# Patient Record
Sex: Male | Born: 1950
Health system: Southern US, Community
[De-identification: ages and names within clinical notes are randomized; demographics above are authoritative.]

## PROBLEM LIST (undated history)

## (undated) DIAGNOSIS — E785 Hyperlipidemia, unspecified: Secondary | ICD-10-CM

## (undated) DIAGNOSIS — R7303 Prediabetes: Secondary | ICD-10-CM

## (undated) DIAGNOSIS — I10 Essential (primary) hypertension: Secondary | ICD-10-CM

## (undated) HISTORY — DX: Hyperlipidemia, unspecified: E78.5

## (undated) HISTORY — PX: EYE SURGERY: SHX253

## (undated) HISTORY — DX: Essential (primary) hypertension: I10

## (undated) HISTORY — DX: Prediabetes: R73.03

---

## 2015-12-23 ENCOUNTER — Encounter (HOSPITAL_COMMUNITY): Payer: Self-pay

## 2015-12-23 ENCOUNTER — Emergency Department (HOSPITAL_COMMUNITY): Payer: BLUE CROSS/BLUE SHIELD

## 2015-12-23 ENCOUNTER — Emergency Department (HOSPITAL_COMMUNITY)
Admission: EM | Admit: 2015-12-23 | Discharge: 2015-12-24 | Disposition: A | Discharge status: Home / Self Care | Payer: BLUE CROSS/BLUE SHIELD | Attending: Emergency Medicine | Admitting: Emergency Medicine

## 2015-12-23 DIAGNOSIS — F17210 Nicotine dependence, cigarettes, uncomplicated: Secondary | ICD-10-CM | POA: Insufficient documentation

## 2015-12-23 DIAGNOSIS — S62665A Nondisplaced fracture of distal phalanx of left ring finger, initial encounter for closed fracture: Secondary | ICD-10-CM | POA: Diagnosis not present

## 2015-12-23 DIAGNOSIS — S67195A Crushing injury of left ring finger, initial encounter: Secondary | ICD-10-CM | POA: Diagnosis present

## 2015-12-23 DIAGNOSIS — S60052A Contusion of left little finger without damage to nail, initial encounter: Secondary | ICD-10-CM | POA: Diagnosis not present

## 2015-12-23 DIAGNOSIS — Y929 Unspecified place or not applicable: Secondary | ICD-10-CM | POA: Insufficient documentation

## 2015-12-23 DIAGNOSIS — Y999 Unspecified external cause status: Secondary | ICD-10-CM | POA: Insufficient documentation

## 2015-12-23 DIAGNOSIS — S6722XA Crushing injury of left hand, initial encounter: Secondary | ICD-10-CM

## 2015-12-23 DIAGNOSIS — Y939 Activity, unspecified: Secondary | ICD-10-CM | POA: Insufficient documentation

## 2015-12-23 DIAGNOSIS — S62639A Displaced fracture of distal phalanx of unspecified finger, initial encounter for closed fracture: Secondary | ICD-10-CM

## 2015-12-23 DIAGNOSIS — W231XXA Caught, crushed, jammed, or pinched between stationary objects, initial encounter: Secondary | ICD-10-CM | POA: Insufficient documentation

## 2015-12-23 MED ORDER — CEPHALEXIN 500 MG PO CAPS
500.0000 mg | ORAL_CAPSULE | Freq: Two times a day (BID) | ORAL | Status: DC
Start: 2015-12-23 — End: 2021-06-17

## 2015-12-23 MED ORDER — NAPROXEN 250 MG PO TABS: 250.0000 mg | ORAL_TABLET | Freq: Two times a day (BID) | ORAL | Status: DC

## 2015-12-23 NOTE — ED Notes (Addendum)
Patient here with left hand 4th and 5th digit crush injuries. Caught fingers in jack last night, here from urgent care for further evaluation of fractures to fingers. Received IM rochephin and tetanus pta. Patient here with xray CD of fractures

## 2015-12-23 NOTE — ED Notes (Signed)
Pt is in stable condition upon d/c and ambulates from ED. 

## 2015-12-23 NOTE — Discharge Instructions (Signed)
Crush Injury, Fingers or Toes A crush injury to the fingers or toes means the tissues have been damaged by being squeezed (compressed). There will be bleeding into the tissues and swelling. Often, blood will collect under the skin. When this happens, the skin on the finger often dies and may slough off (shed) 1 week to 10 days later. Usually, new skin is growing underneath. If the injury has been too severe and the tissue does not survive, the damaged tissue may begin to turn black over several days.  Wounds which occur because of the crushing may be stitched (sutured) shut. However, crush injuries are more likely to become infected than other injuries.These wounds may not be closed as tightly as other types of cuts to prevent infection. Nails involved are often lost. These usually grow back over several weeks.  DIAGNOSIS X-rays may be taken to see if there is any injury to the bones. TREATMENT Broken bones (fractures) may be treated with splinting, depending on the fracture. Often, no treatment is required for fractures of the last bone in the fingers or toes. HOME CARE INSTRUCTIONS   The crushed part should be raised (elevated) above the heart or center of the chest as much as possible for the first several days or as directed. This helps with pain and lessens swelling. Less swelling increases the chances that the crushed part will survive.  Put ice on the injured area.  Put ice in a plastic bag.  Place a towel between your skin and the bag.  Leave the ice on for 15-20 minutes, 03-04 times a day for the first 2 days.  Only take over-the-counter or prescription medicines for pain, discomfort, or fever as directed by your caregiver.  Use your injured part only as directed.  Change your bandages (dressings) as directed.  Keep all follow-up appointments as directed by your caregiver. Not keeping your appointment could result in a chronic or permanent injury, pain, and disability. If there is  any problem keeping the appointment, you must call to reschedule. SEEK IMMEDIATE MEDICAL CARE IF:   There is redness, swelling, or increasing pain in the wound area.  Pus is coming from the wound.  You have a fever.  You notice a bad smell coming from the wound or dressing.  The edges of the wound do not stay together after the sutures have been removed.  You are unable to move the injured finger or toe. MAKE SURE YOU:   Understand these instructions.  Will watch your condition.  Will get help right away if you are not doing well or get worse.   This information is not intended to replace advice given to you by your health care provider. Make sure you discuss any questions you have with your health care provider.   Document Released: 05/30/2005 Document Revised: 08/22/2011 Document Reviewed: 10/15/2010 Elsevier Interactive Patient Education 2016 [place] or Splint Care Casts and splints support injured limbs and keep bones from moving while they heal. It is important to care for your cast or splint at home.  HOME CARE INSTRUCTIONS  Keep the cast or splint uncovered during the drying period. It can take 24 to 48 hours to dry if it is made of plaster. A fiberglass cast will dry in less than 1 hour.  Do not rest the cast on anything harder than a pillow for the first 24 hours.  Do not put weight on your injured limb or apply pressure to the cast until your  health care provider gives you permission.  Keep the cast or splint dry. Wet casts or splints can lose their shape and may not support the limb as well. A wet cast that has lost its shape can also create harmful pressure on your skin when it dries. Also, wet skin can become infected.  Cover the cast or splint with a plastic bag when bathing or when out in the rain or snow. If the cast is on the trunk of the body, take sponge baths until the cast is removed.  If your cast does become wet, dry it with a towel or a  blow dryer on the cool setting only.  Keep your cast or splint clean. Soiled casts may be wiped with a moistened cloth.  Do not place any hard or soft foreign objects under your cast or splint, such as cotton, toilet paper, lotion, or powder.  Do not try to scratch the skin under the cast with any object. The object could get stuck inside the cast. Also, scratching could lead to an infection. If itching is a problem, use a blow dryer on a cool setting to relieve discomfort.  Do not trim or cut your cast or remove padding from inside of it.  Exercise all joints next to the injury that are not immobilized by the cast or splint. For example, if you have a long leg cast, exercise the hip joint and toes. If you have an arm cast or splint, exercise the shoulder, elbow, thumb, and fingers.  Elevate your injured arm or leg on 1 or 2 pillows for the first 1 to 3 days to decrease swelling and pain.It is best if you can comfortably elevate your cast so it is higher than your heart. SEEK MEDICAL CARE IF:   Your cast or splint cracks.  Your cast or splint is too tight or too loose.  You have unbearable itching inside the cast.  Your cast becomes wet or develops a soft spot or area.  You have a bad smell coming from inside your cast.  You get an object stuck under your cast.  Your skin around the cast becomes red or raw.  You have new pain or worsening pain after the cast has been applied. SEEK IMMEDIATE MEDICAL CARE IF:   You have fluid leaking through the cast.  You are unable to move your fingers or toes.  You have discolored (blue or white), cool, painful, or very swollen fingers or toes beyond the cast.  You have tingling or numbness around the injured area.  You have severe pain or pressure under the cast.  You have any difficulty with your breathing or have shortness of breath.  You have chest pain.   This information is not intended to replace advice given to you by your  health care provider. Make sure you discuss any questions you have with your health care provider.   Document Released: 05/27/2000 Document Revised: 03/20/2013 Document Reviewed: 12/06/2012 Elsevier Interactive Patient Education [employer].

## 2015-12-23 NOTE — ED Provider Notes (Signed)
CSN: [id number]     Arrival date & time 12/23/15  1438 History   First MD Initiated Contact with Patient 12/23/15 1658     No chief complaint on file.   Muse Brincefield is a 65 y.o. right hand dominant male who presents to the ED from urgent care with crush injury to his left 4th and 5 fingers from last night.  The patient reports that he was working with a car jack when it slipped and fell on his left 4th and 5th fingers last night. He reports he went to urgent care today who told him he has an open fracture and sent him to the ED. His Tdap was updated at urgent care. He was also given IM rocephin. He complains of only mild pain. He took tylenol earlier this morning for his pain. He denies fevers, numbness, tingling or weakness.   The history is provided by the patient. No language interpreter was used.    History reviewed. No pertinent past medical history. History reviewed. No pertinent past surgical history. No family history on file. Social History  Substance Use Topics  . Smoking status: Current Every Day Smoker    Types: Cigars  . Smokeless tobacco: None  . Alcohol Use: None    Review of Systems  Constitutional: Negative for fever.  Musculoskeletal: Positive for arthralgias.  Skin: Positive for color change and wound.  Neurological: Negative for weakness and numbness.      Allergies  Review of patient's allergies indicates no known allergies.  Home Medications   Prior to Admission medications   Medication Sig Start Date End Date Taking? Authorizing Provider  naproxen (NAPROSYN) 250 MG tablet Take 1 tablet (250 mg total) by mouth 2 (two) times daily with a meal. 12/23/15   Waynetta Pean, PA-C   BP 137/89 mmHg  Pulse 54  Temp(Src) 98.5 F (36.9 C) (Oral)  Resp 19  SpO2 100% Physical Exam  Constitutional: He appears well-developed and well-nourished. No distress.  HENT:  Head: Normocephalic and atraumatic.  Eyes: Right eye exhibits no discharge. Left eye exhibits no  discharge.  Cardiovascular: Normal rate, regular rhythm and intact distal pulses.   Bilateral radial pulses are intact. Good capillary refill to his left distal fingertips.  Pulmonary/Chest: Effort normal. No respiratory distress.  Musculoskeletal: He exhibits tenderness.  Ecchymosis and mild edema to his left fourth and fifth distal finger tips. See picture below for further details. Patient is able to make a fist. He has good strength with flexion and extension of his fingers. There is a small area of overlying abrasion to his left fourth finger. Nail beds are intact. No subungual hematoma. There is subungual ecchymosis. No deformity.  Neurological: He is alert. Coordination normal.  Sensation is intact to his left distal fingertips.  Skin: Skin is warm and dry. No rash noted. He is not diaphoretic. No erythema. No pallor.  See picture.  Psychiatric: He has a normal mood and affect. His behavior is normal.  Nursing note and vitals reviewed.       ED Course  Procedures (including critical care time) Labs Review Labs Reviewed - No data to display  Imaging Review Dg Hand Complete Left  12/23/2015  CLINICAL DATA:  Acute left hand pain following injury last night. Initial encounter. EXAM: LEFT HAND - COMPLETE 3+ VIEW COMPARISON:  None. FINDINGS: A nondisplaced tuft fracture of the ring finger noted. No other fracture, subluxation or dislocation identified. No focal bony lesions or radiopaque foreign bodies. IMPRESSION: Ring finger  tuft fracture. Electronically Signed   By: Margarette Canada M.D.   On: 12/23/2015 18:24   I have personally reviewed and evaluated these images as part of my medical decision-making.   EKG Interpretation None      Filed Vitals:   12/23/15 1448 12/23/15 1715  BP: 174/99 137/89  Pulse: 65 54  Temp: 98.5 F (36.9 C)   TempSrc: Oral   Resp: 19   SpO2: 100% 100%     MDM   Meds given in ED:  Medications - No data to display  New Prescriptions   NAPROXEN  (NAPROSYN) 250 MG TABLET    Take 1 tablet (250 mg total) by mouth 2 (two) times daily with a meal.    Final diagnoses:  Crushing injury of finger of left hand, initial encounter  Closed fracture of tuft of distal phalanx of finger, initial encounter    This  is a 65 y.o. right hand dominant male who presents to the ED from urgent care with crush injury to his left 4th and 5 fingers from last night.  The patient reports that he was working with a car jack when it slipped and fell on his left 4th and 5th fingers last night. He reports he went to urgent care today who told him he has an open fracture and sent him to the ED. His Tdap was updated at urgent care. He was also given IM rocephin. He complains of only mild pain. He took tylenol earlier this morning for his pain. He denies fevers, numbness, tingling or weakness.  On exam the patient is afebrile nontoxic appearing. He has ecchymosis to the distal portions of his left fourth and fifth fingers. He has subungual ecchymosis but no evidence of subungual hematoma. Nail beds are intact. Finger pads are soft. Good capillary refill. X-ray shows a ring finger tuft fracture. Will place the patient and finger splints to his left fourth and fifth fingers and have him follow-up with orthopedic hand surgeon Dr. Burney Gauze for recheck. Naproxen for pain control. Keflex as this is an open fracture. I discussed return precautions. I discussed wound care and splint care.  I advised the patient to follow-up with their primary care provider this week. I advised the patient to return to the emergency department with new or worsening symptoms or new concerns. The patient verbalized understanding and agreement with plan.    Waynetta Pean, PA-C 12/23/15 1906  Leo Grosser, MD 12/24/15 [phone]

## 2017-06-11 IMAGING — DX DG HAND COMPLETE 3+V*L*
3 series · 3 of 3 positions shown · non-contrast
Comparison: None.

CLINICAL DATA: Acute left hand pain following injury last night.
Initial encounter.

EXAM:
LEFT HAND - COMPLETE 3+ VIEW

[x hand pa left]
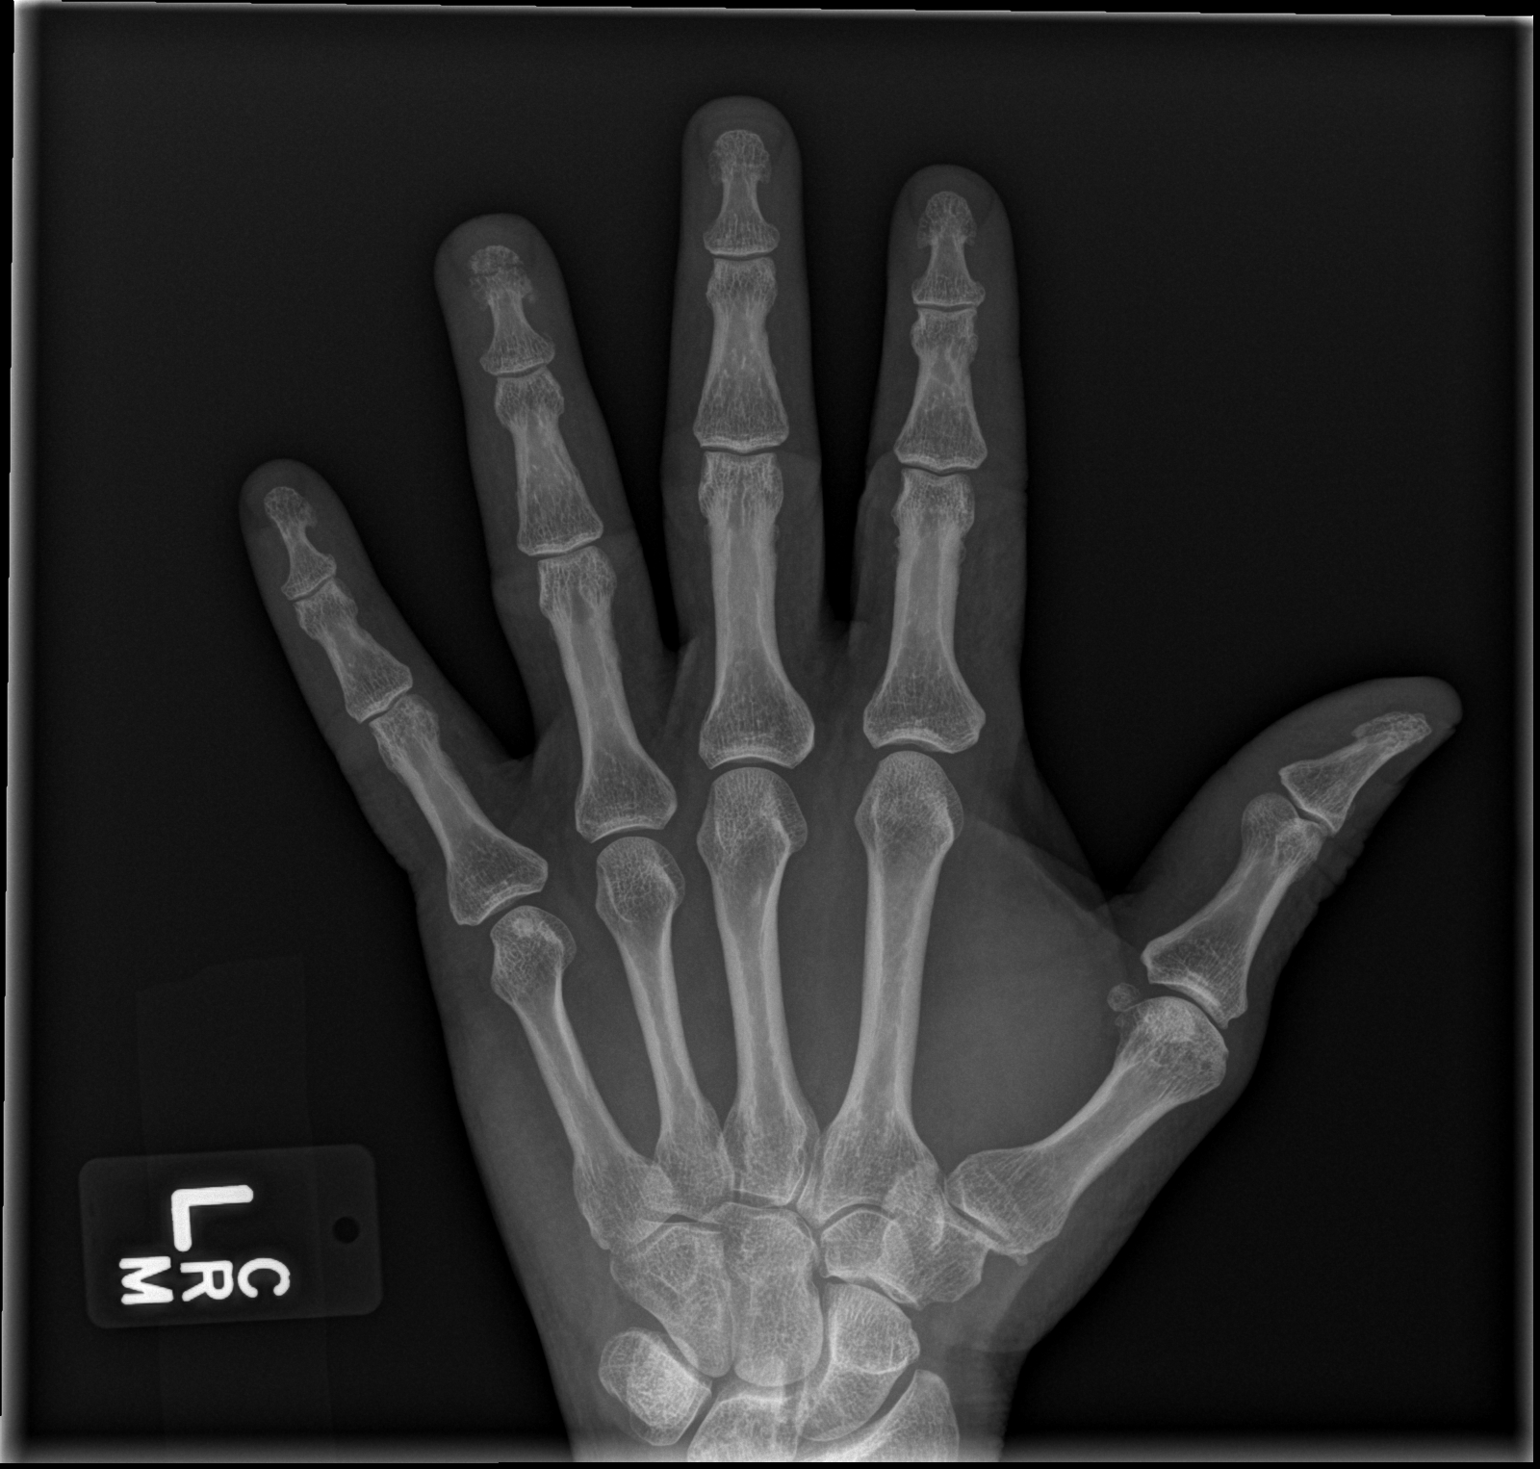

[x hand obl left]
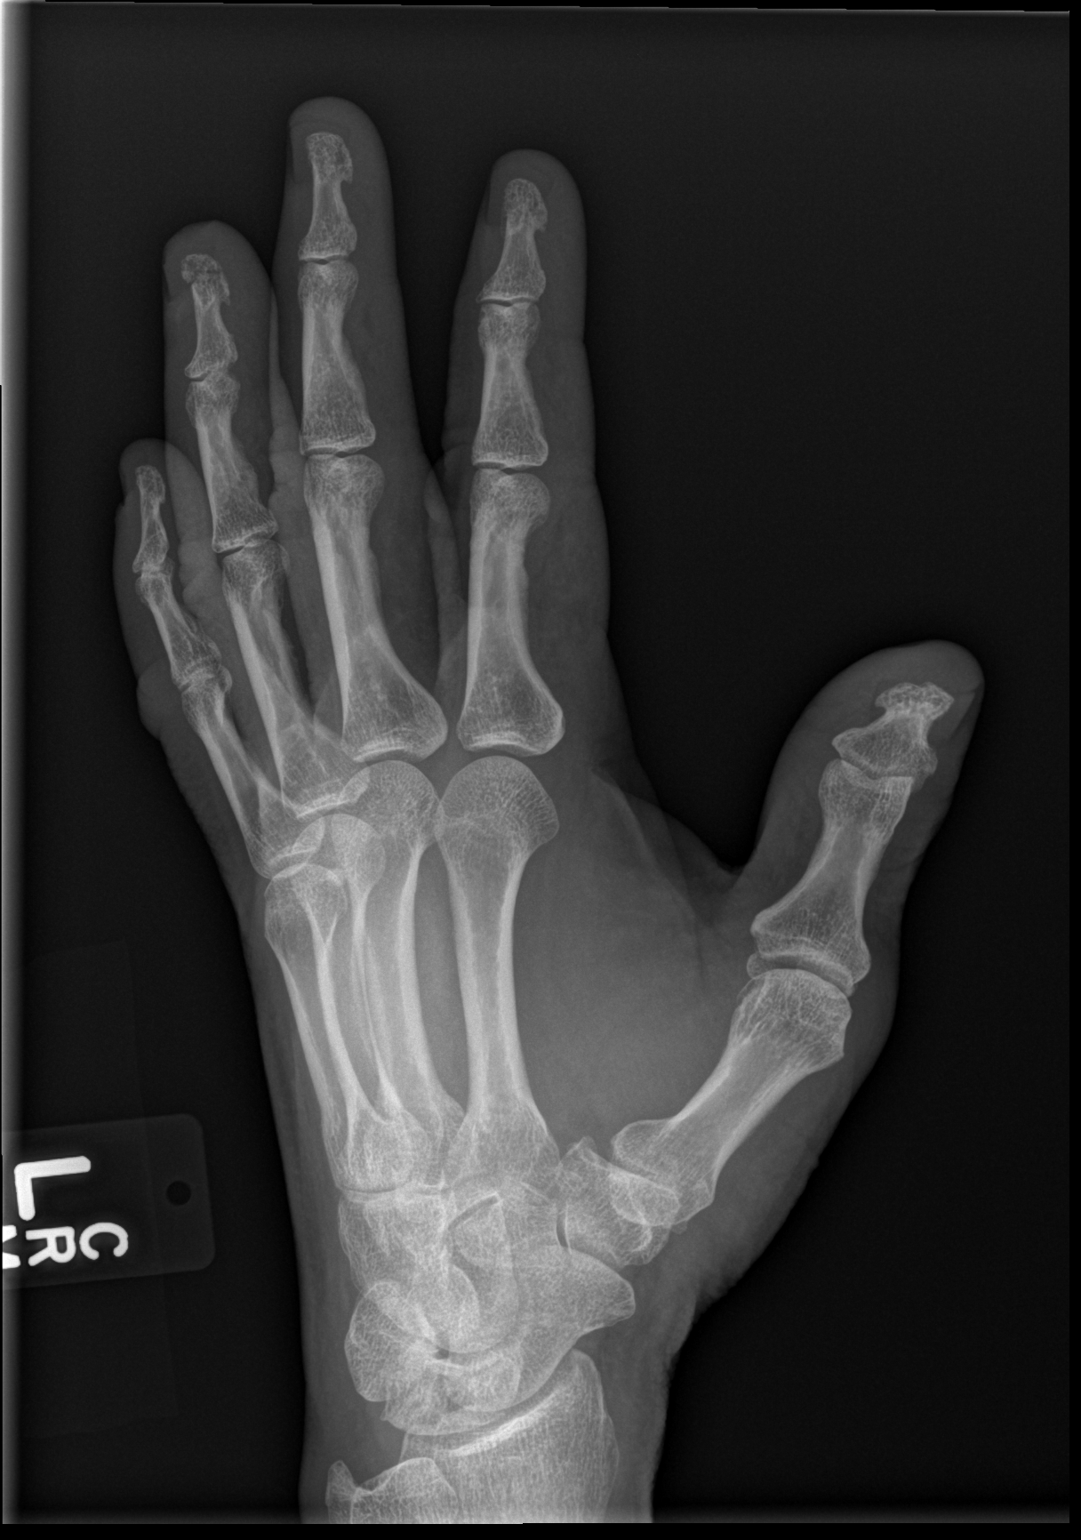

[x hand lat left]
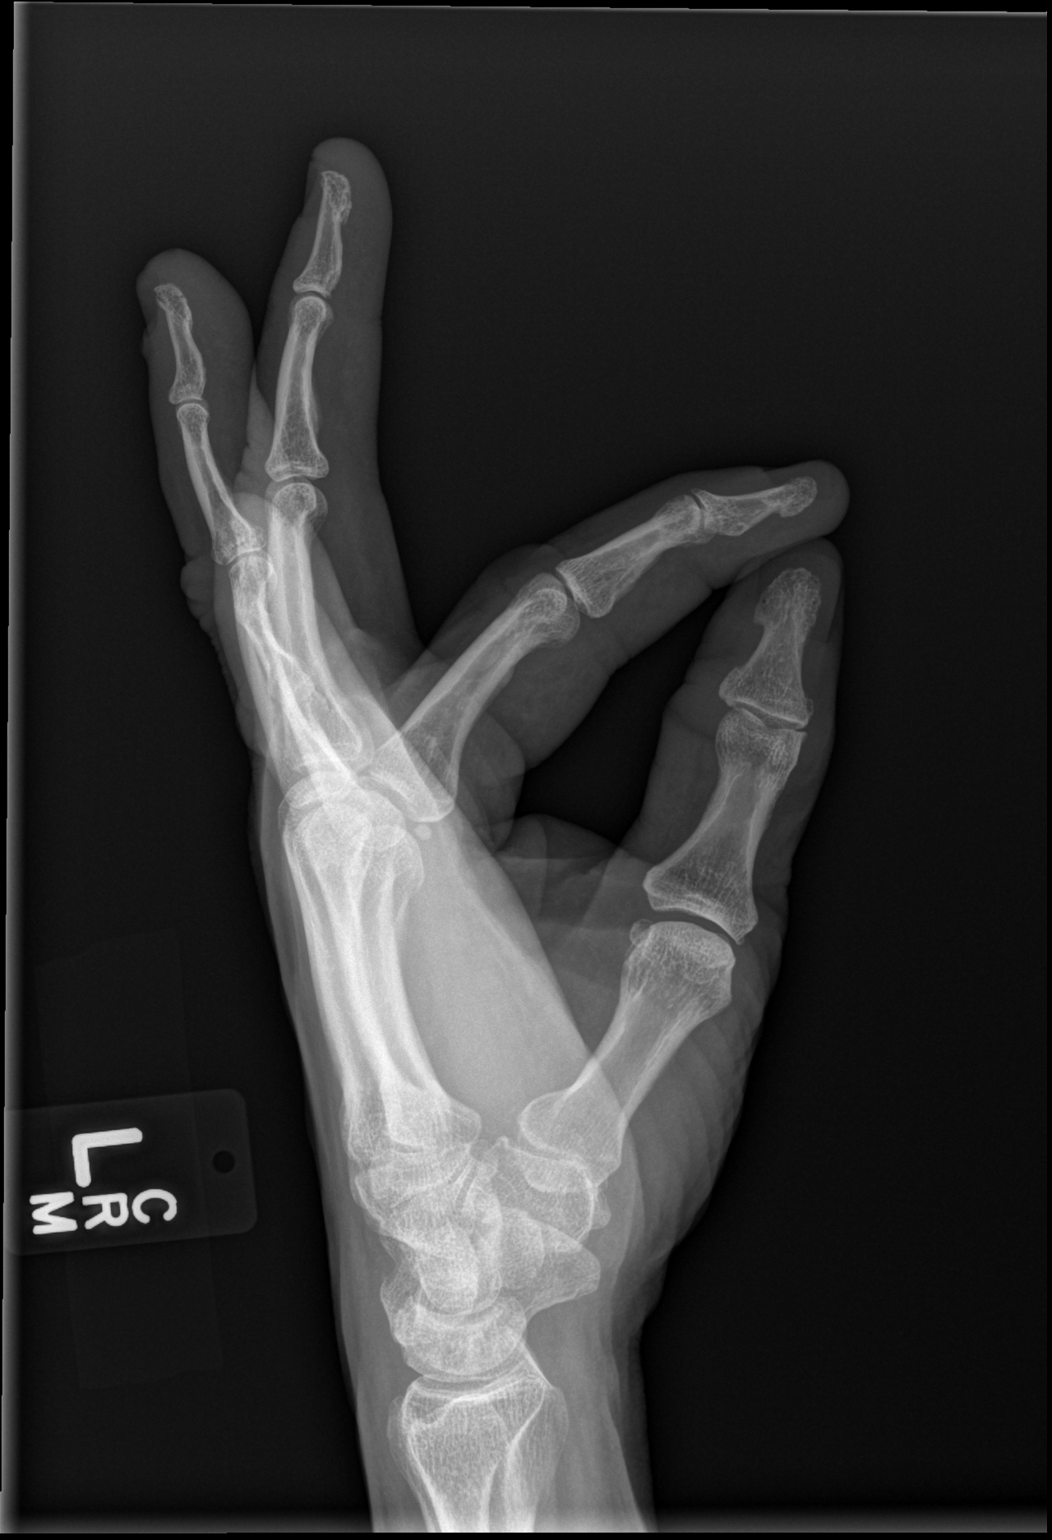

[3 of 3 positions shown; findings below may reference images not displayed]

FINDINGS: A nondisplaced tuft fracture of the ring finger noted.

No other fracture, subluxation or dislocation identified.

No focal bony lesions or radiopaque foreign bodies.
IMPRESSION: Ring finger tuft fracture.

## 2018-08-07 DIAGNOSIS — Z23 Encounter for immunization: Secondary | ICD-10-CM | POA: Diagnosis not present

## 2018-08-07 DIAGNOSIS — Z0000 Encounter for general adult medical examination without abnormal findings: Secondary | ICD-10-CM | POA: Diagnosis not present

## 2018-08-07 DIAGNOSIS — Z6833 Body mass index (BMI) 33.0-33.9, adult: Secondary | ICD-10-CM | POA: Diagnosis not present

## 2018-08-07 DIAGNOSIS — R7303 Prediabetes: Secondary | ICD-10-CM | POA: Diagnosis not present

## 2018-10-25 DIAGNOSIS — D3131 Benign neoplasm of right choroid: Secondary | ICD-10-CM | POA: Diagnosis not present

## 2019-01-14 DIAGNOSIS — Z86010 Personal history of colonic polyps: Secondary | ICD-10-CM | POA: Diagnosis not present

## 2019-01-14 DIAGNOSIS — Z1211 Encounter for screening for malignant neoplasm of colon: Secondary | ICD-10-CM | POA: Diagnosis not present

## 2019-01-14 LAB — HM COLONOSCOPY

## 2019-08-09 DIAGNOSIS — H61899 Other specified disorders of external ear, unspecified ear: Secondary | ICD-10-CM | POA: Diagnosis not present

## 2019-08-09 DIAGNOSIS — Z8669 Personal history of other diseases of the nervous system and sense organs: Secondary | ICD-10-CM | POA: Diagnosis not present

## 2020-07-18 DIAGNOSIS — Z20822 Contact with and (suspected) exposure to covid-19: Secondary | ICD-10-CM | POA: Diagnosis not present

## 2021-04-27 DIAGNOSIS — D3131 Benign neoplasm of right choroid: Secondary | ICD-10-CM | POA: Diagnosis not present

## 2021-06-09 DIAGNOSIS — Z20822 Contact with and (suspected) exposure to covid-19: Secondary | ICD-10-CM | POA: Diagnosis not present

## 2021-06-09 DIAGNOSIS — Z03818 Encounter for observation for suspected exposure to other biological agents ruled out: Secondary | ICD-10-CM | POA: Diagnosis not present

## 2021-06-17 ENCOUNTER — Encounter: Payer: Self-pay | Admitting: Family Medicine

## 2021-06-17 ENCOUNTER — Ambulatory Visit (INDEPENDENT_AMBULATORY_CARE_PROVIDER_SITE_OTHER): Payer: PPO | Admitting: Family Medicine

## 2021-06-17 ENCOUNTER — Ambulatory Visit (INDEPENDENT_AMBULATORY_CARE_PROVIDER_SITE_OTHER): Payer: PPO

## 2021-06-17 ENCOUNTER — Other Ambulatory Visit: Payer: Self-pay

## 2021-06-17 DIAGNOSIS — E6609 Other obesity due to excess calories: Secondary | ICD-10-CM | POA: Diagnosis not present

## 2021-06-17 DIAGNOSIS — N401 Enlarged prostate with lower urinary tract symptoms: Secondary | ICD-10-CM | POA: Diagnosis not present

## 2021-06-17 DIAGNOSIS — N138 Other obstructive and reflux uropathy: Secondary | ICD-10-CM | POA: Diagnosis not present

## 2021-06-17 DIAGNOSIS — E669 Obesity, unspecified: Secondary | ICD-10-CM | POA: Insufficient documentation

## 2021-06-17 DIAGNOSIS — I10 Essential (primary) hypertension: Secondary | ICD-10-CM | POA: Insufficient documentation

## 2021-06-17 DIAGNOSIS — Z6833 Body mass index (BMI) 33.0-33.9, adult: Secondary | ICD-10-CM

## 2021-06-17 DIAGNOSIS — Z23 Encounter for immunization: Secondary | ICD-10-CM | POA: Diagnosis not present

## 2021-06-17 DIAGNOSIS — Z0001 Encounter for general adult medical examination with abnormal findings: Secondary | ICD-10-CM | POA: Insufficient documentation

## 2021-06-17 NOTE — Patient Instructions (Addendum)
Things to do to keep yourself healthy  - Exercise at least 30-45 minutes a day, 3-4 days a week.  - Eat a low-fat diet with lots of fruits and vegetables, up to 7-9 servings per day.  - Seatbelts can save your life. Wear them always.  - Smoke detectors on every level of your home, check batteries every year.  - Eye Doctor - have an eye exam every 1-2 years  - Safe sex - if you may be exposed to STDs, use a condom.  - Alcohol -  If you drink, do it moderately, less than 2 drinks per day.  - [place]. Choose someone to speak for you if you are not able.       Please bring - Depression is common in our stressful world.If you're feeling down or losing interest in things you normally enjoy, please come in for a visit.  - Violence - If anyone is threatening or hurting you, please call immediately.  Recommend quit smoking cigars.  Urinozinc for prostate hypertrophy.

## 2021-06-17 NOTE — Progress Notes (Addendum)
Subjective:  Patient ID: Johnathan Flynn, male    DOB: Nov 10, 1950  Age: 71 y.o. MRN: [id number]  Chief Complaint  Patient presents with   Annual Exam    HPI  Well Adult Physical: Patient here for a comprehensive physical exam.The patient reports problems - fatigue. Cough. Urinary urgency. Do you take any herbs or supplements that were not prescribed by a doctor? yes - fish oil. Are you taking calcium supplements? no Are you taking aspirin daily? yes  Encounter for general adult medical examination without abnormal findings  Physical ("At Risk" items are starred): Patient's last physical exam was 1 year ago .  Patient wears a seat belt, has smoke detectors, but does not have carbon monoxide detectors and likely needs one. Does not own a gun.  Dental Care: biannual cleanings, brushes and flosses daily. Ophthalmology/Optometry: Annual visit. Last visit was fall 2022.  Hearing loss: none Vision impairments: glasses Last PSA: unsure.  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  Tobacco Use   Smoking status: Every Day    Types: Cigars   Smokeless tobacco: Not on file  Substance and Sexual Activity   Alcohol use: Not on file   Drug use: Not on file   Sexual activity: Yes    Comment: one partner  Other Topics Concern   Not on file  Social History Narrative   Widowed. Girlfriend.    Children:    Coffee 2-4 cups per day.    Social Determinants of Health   Financial Resource Strain: Low Risk    Difficulty of Paying Living Expenses: Not hard at all  Food Insecurity: No Food Insecurity   Worried About Charity fundraiser in the Last Year: Never true   [place] in the Last Year: Never true   Transportation Needs: No Transportation Needs   Lack of Transportation (Medical): No   Lack of Transportation (Non-Medical): No  Physical Activity: Sufficiently Active   Days of Exercise per Week: 3 days   Minutes of Exercise per Session: 60 min  Stress: Stress Concern Present   Feeling of Stress : To some extent  Social Connections: Not on file   Past Medical History:  Diagnosis Date   Hypertension    History reviewed. No pertinent surgical history.  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  Tobacco Use   Smoking status: Every Day    Types: Cigars   Smokeless tobacco: Not on file  Substance and Sexual Activity   Alcohol use: Not on file   Drug use: Not on file   Sexual activity: Yes    Comment: one partner  Other Topics Concern   Not on file  Social History Narrative   Widowed. Girlfriend.    Children:    Coffee  2-4 cups per day.    Social Determinants of Health   Financial Resource Strain: Low Risk    Difficulty of Paying Living Expenses: Not hard at all  Food Insecurity: No Food Insecurity   Worried About Charity fundraiser in the Last Year: Never true   [place] in the Last Year: Never true  Transportation Needs: No Transportation Needs   Lack of Transportation (Medical): No   Lack of Transportation (Non-Medical): No  Physical Activity: Sufficiently Active   Days of Exercise per Week: 3 days   Minutes of Exercise per Session: 60 min  Stress: Stress Concern Present   Feeling of Stress : To some extent  Social Connections: Not on file   Review of Systems  Constitutional:  Positive for fatigue. Negative for chills and fever.   HENT:  Negative for congestion, rhinorrhea and sore throat.   Respiratory:  Positive for cough. Negative for shortness of breath.   Cardiovascular:  Negative for chest pain and palpitations.  Gastrointestinal:  Negative for abdominal pain, constipation, diarrhea, nausea and vomiting.  Genitourinary:  Positive for urgency. Negative for dysuria.  Musculoskeletal:  Negative for arthralgias, back pain and myalgias.  Neurological:  Negative for dizziness and headaches.  Psychiatric/Behavioral:  Negative for dysphoric mood. The patient is not nervous/anxious.     Objective:  BP (!) 170/90    Pulse 72    Temp 99.2 F (37.3 C)    Resp 16    Ht 5\' 8"  (1.727 m)    Wt 219 lb (99.3 kg)    BMI 33.30 kg/m   BP/Weight 06/17/2021 1/61/0960  Systolic BP 454 098  Diastolic BP 90 93  Wt. (Lbs) 219 -  BMI 33.3 -    Physical Exam Vitals reviewed.  Constitutional:      Appearance: Normal appearance.  HENT:     Right Ear: Tympanic membrane, ear canal and external ear normal.     Left Ear: Tympanic membrane, ear canal and external ear normal.     Nose: Nose normal. No congestion or rhinorrhea.     Mouth/Throat:     Mouth: Mucous membranes are moist.     Pharynx: No oropharyngeal exudate or posterior oropharyngeal erythema.  Neck:     Vascular: No carotid bruit.  Cardiovascular:     Rate and Rhythm: Normal rate and regular rhythm.     Pulses: Normal pulses.     Heart sounds: Normal heart sounds.  Pulmonary:     Effort: Pulmonary effort is normal. No respiratory distress.     Breath sounds: Normal breath sounds. No wheezing, rhonchi or rales.  Abdominal:     General: Bowel sounds are normal.     Palpations: Abdomen is soft.     Tenderness: There is no abdominal tenderness.  Lymphadenopathy:     Cervical: No cervical adenopathy.  Neurological:     Mental Status: He is alert.  Psychiatric:        Mood and Affect: Mood normal.        Behavior: Behavior normal.    Lab Results  Component  Value Date   WBC 4.3 06/18/2021   HGB 13.9 06/18/2021   HCT 42.0 06/18/2021   PLT 170 06/18/2021   GLUCOSE 112 (H) 06/18/2021   CHOL 164 06/18/2021   TRIG 52 06/18/2021   HDL 45 06/18/2021   LDLCALC 108 (H) 06/18/2021   ALT 14 06/18/2021   AST 15 06/18/2021   NA 137 06/18/2021   K 4.5  06/18/2021   CL 102 06/18/2021   CREATININE 0.89 06/18/2021   BUN 16 06/18/2021   CO2 23 06/18/2021   TSH 1.390 06/18/2021   HGBA1C 6.1 (H) 06/18/2021      Assessment & Plan:   Problem List Items Addressed This Visit       Cardiovascular and Mediastinum   Essential hypertension, benign    Check bp at home.  Return in approximately one month for chronic visit and to follow up on hypertension.  Labs came back to show prediabetes (new dx).      Relevant Medications   aspirin EC 81 MG tablet     Genitourinary   BPH with urinary obstruction    Urinozinc for prostate hypertrophy.       Relevant Orders   PSA (Completed)     Other   Encounter for routine adult physical exam with abnormal findings    Things to do to keep yourself healthy  - Exercise at least 30-45 minutes a day, 3-4 days a week.  - Eat a low-fat diet with lots of fruits and vegetables, up to 7-9 servings per day.  - Seatbelts can save your life. Wear them always.  - Smoke detectors on every level of your home, check batteries every year.  - Eye Doctor - have an eye exam every 1-2 years  - Safe sex - if you may be exposed to STDs, use a condom.  - Alcohol -  If you drink, do it moderately, less than 2 drinks per day.  - [place]. Choose someone to speak for you if you are not able.       Please bring - Depression is common in our stressful world.If you're feeling down or losing interest in things you normally enjoy, please come in for a visit.  - Violence - If anyone is threatening or hurting you, please call immediately.  Recommend quit smoking cigars.       Class 1 obesity without serious  comorbidity with body mass index (BMI) of 33.0 to 33.9 in adult    Recommend continue to work on eating healthy diet and exercise.        Body mass index is 33.3 kg/m.      This is a list of the screening recommended for you and due dates:  Health Maintenance  Topic Date Due   Pneumonia Vaccine (1 - PCV) Never done   Hepatitis C Screening: USPSTF Recommendation to screen - Ages 44-79 yo.  Never done   Tetanus Vaccine  Never done   Colon Cancer Screening  Never done   Zoster (Shingles) Vaccine (1 of 2) Never done   Flu Shot  Completed   COVID-19 Vaccine  Completed   HPV Vaccine  Aged Out     No orders of the defined types were placed in this encounter.    Follow-up: Return in about 1 day (around 06/18/2021) for Nurse visit (fasting and BP check.).  An After Visit Summary was printed and given to the patient.  Rochel Brome, MD [facility] [phone]

## 2021-06-18 ENCOUNTER — Other Ambulatory Visit: Payer: PPO

## 2021-06-18 DIAGNOSIS — N138 Other obstructive and reflux uropathy: Secondary | ICD-10-CM | POA: Diagnosis not present

## 2021-06-18 DIAGNOSIS — I10 Essential (primary) hypertension: Secondary | ICD-10-CM

## 2021-06-18 DIAGNOSIS — R7301 Impaired fasting glucose: Secondary | ICD-10-CM | POA: Diagnosis not present

## 2021-06-18 DIAGNOSIS — N401 Enlarged prostate with lower urinary tract symptoms: Secondary | ICD-10-CM | POA: Diagnosis not present

## 2021-06-19 LAB — COMPREHENSIVE METABOLIC PANEL
ALT: 14 IU/L (ref 0–44)
AST: 15 IU/L (ref 0–40)
Albumin/Globulin Ratio: 2.1 (ref 1.2–2.2)
Albumin: 4.2 g/dL (ref 3.8–4.8)
Alkaline Phosphatase: 61 IU/L (ref 44–121)
BUN/Creatinine Ratio: 18 (ref 10–24)
BUN: 16 mg/dL (ref 8–27)
Bilirubin Total: 0.4 mg/dL (ref 0.0–1.2)
CO2: 23 mmol/L (ref 20–29)
Calcium: 9.6 mg/dL (ref 8.6–10.2)
Chloride: 102 mmol/L (ref 96–106)
Creatinine, Ser: 0.89 mg/dL (ref 0.76–1.27)
Globulin, Total: 2 g/dL (ref 1.5–4.5)
Glucose: 112 mg/dL — ABNORMAL HIGH (ref 70–99)
Potassium: 4.5 mmol/L (ref 3.5–5.2)
Sodium: 137 mmol/L (ref 134–144)
Total Protein: 6.2 g/dL (ref 6.0–8.5)
eGFR: 93 mL/min/{1.73_m2} (ref >59)

## 2021-06-19 LAB — CBC WITH DIFFERENTIAL/PLATELET
Basophils Absolute: 0 10*3/uL (ref 0.0–0.2)
Basos: 1 %
EOS (ABSOLUTE): 0.1 10*3/uL (ref 0.0–0.4)
Eos: 2 %
Hematocrit: 42 % (ref 37.5–51.0)
Hemoglobin: 13.9 g/dL (ref 13.0–17.7)
Immature Grans (Abs): 0 10*3/uL (ref 0.0–0.1)
Immature Granulocytes: 1 %
Lymphocytes Absolute: 0.7 10*3/uL (ref 0.7–3.1)
Lymphs: 15 %
MCH: 29.2 pg (ref 26.6–33.0)
MCHC: 33.1 g/dL (ref 31.5–35.7)
MCV: 88 fL (ref 79–97)
Monocytes Absolute: 0.5 10*3/uL (ref 0.1–0.9)
Monocytes: 11 %
Neutrophils Absolute: 3.1 10*3/uL (ref 1.4–7.0)
Neutrophils: 70 %
Platelets: 170 10*3/uL (ref 150–450)
RBC: 4.76 x10E6/uL (ref 4.14–5.80)
RDW: 13.8 % (ref 11.6–15.4)
WBC: 4.3 10*3/uL (ref 3.4–10.8)

## 2021-06-19 LAB — LIPID PANEL
Chol/HDL Ratio: 3.6 ratio (ref 0.0–5.0)
Cholesterol, Total: 164 mg/dL (ref 100–199)
HDL: 45 mg/dL (ref >39)
LDL Chol Calc (NIH): 108 mg/dL — ABNORMAL HIGH (ref 0–99)
Triglycerides: 52 mg/dL (ref 0–149)
VLDL Cholesterol Cal: 11 mg/dL (ref 5–40)

## 2021-06-19 LAB — CARDIOVASCULAR RISK ASSESSMENT

## 2021-06-19 LAB — PSA: Prostate Specific Ag, Serum: 2.9 ng/mL (ref 0.0–4.0)

## 2021-06-19 LAB — TSH: TSH: 1.39 u[IU]/mL (ref 0.450–4.500)

## 2021-06-20 NOTE — Progress Notes (Signed)
Blood count normal.  Liver function normal.  Kidney function normal.  Thyroid function normal.  Cholesterol: LDL little up at 108. Recommend low fat diet and exercise.  Glucose elevated. Recommend add a1c.  PSA normal

## 2021-06-21 LAB — SPECIMEN STATUS REPORT

## 2021-06-21 LAB — HGB A1C W/O EAG: Hgb A1c MFr Bld: 6.1 % — ABNORMAL HIGH (ref 4.8–5.6)

## 2021-06-28 ENCOUNTER — Encounter: Payer: Self-pay | Admitting: Family Medicine

## 2021-06-28 NOTE — Assessment & Plan Note (Signed)
Urinozinc for prostate hypertrophy.

## 2021-06-28 NOTE — Assessment & Plan Note (Signed)
Things to do to keep yourself healthy  - Exercise at least 30-45 minutes a day, 3-4 days a week.  - Eat a low-fat diet with lots of fruits and vegetables, up to 7-9 servings per day.  - Seatbelts can save your life. Wear them always.  - Smoke detectors on every level of your home, check batteries every year.  - Eye Doctor - have an eye exam every 1-2 years  - Safe sex - if you may be exposed to STDs, use a condom.  - Alcohol -  If you drink, do it moderately, less than 2 drinks per day.  - [place]. Choose someone to speak for you if you are not able.       Please bring - Depression is common in our stressful world.If you're feeling down or losing interest in things you normally enjoy, please come in for a visit.  - Violence - If anyone is threatening or hurting you, please call immediately.  Recommend quit smoking cigars.

## 2021-06-28 NOTE — Assessment & Plan Note (Signed)
Recommend continue to work on eating healthy diet and exercise.  

## 2021-06-28 NOTE — Assessment & Plan Note (Addendum)
Check bp at home.  Return in approximately one month for chronic visit and to follow up on hypertension.  Labs came back to show prediabetes (new dx).

## 2021-07-19 NOTE — Progress Notes (Signed)
Subjective:  Patient ID: Johnathan Flynn, male    DOB: Jul 18, 1950  Age: 71 y.o. MRN: [id number]  Chief Complaint  Patient presents with   Hypertension    3-4 week follow up   HPI: Hypertension: Bp has improved a little. Patient has been to the gym every other day. Lost 5 lbs in 1 month. Still not eating as healthy as he should be, but he knows what to do. Denies complaints.  Vision exam: 04/27/2021. Dr. Renaldo Fiddler.  Ear Exam: Dr. Gaylyn Cheers 08/08/2020.    Current Outpatient Medications on File Prior to Visit  Medication Sig Dispense Refill   aspirin EC 81 MG tablet Take 81 mg by mouth daily. Swallow whole.     Multiple Vitamin (MULTIVITAMIN) tablet Take 1 tablet by mouth daily.     omega-3 fish oil (MAXEPA) 1000 MG CAPS capsule Take by mouth.     No current facility-administered medications on file prior to visit.   Past Medical History:  Diagnosis Date   Hyperlipidemia    Hypertension    Prediabetes    History reviewed. No pertinent surgical history.  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  Tobacco Use   Smoking status: Every Day    Types: Cigars   Smokeless tobacco: Not on file  Substance and Sexual Activity   Alcohol use: Not on file   Drug use: Not on file   Sexual activity: Yes    Comment: one partner  Other Topics Concern   Not on file  Social History Narrative   Widowed. Girlfriend.    Children:    Coffee 2-4 cups per day.    Social Determinants of Health   Financial Resource Strain: Low Risk    Difficulty of Paying Living Expenses: Not hard at all  Food Insecurity: No Food Insecurity   Worried About Charity fundraiser in the Last Year: Never  true   [place] in the Last Year: Never true  Transportation Needs: No Transportation Needs   Lack of Transportation (Medical): No   Lack of Transportation (Non-Medical): No  Physical Activity: Sufficiently Active   Days of Exercise per Week: 3 days   Minutes of Exercise per Session: 60 min  Stress: Stress Concern Present   Feeling of Stress : To some extent  Social Connections: Not on file    Review of Systems  Constitutional:  Negative for appetite change, fatigue and fever.  HENT:  Negative for congestion, ear pain, sinus pressure and sore throat.   Eyes:  Negative for pain.  Respiratory:  Negative for cough, chest tightness, shortness of breath and wheezing.   Cardiovascular:  Negative for chest pain and palpitations.  Gastrointestinal:  Negative for abdominal pain, constipation, diarrhea, nausea and vomiting.  Genitourinary:  Negative for dysuria and hematuria.  Musculoskeletal:  Negative for arthralgias, back pain, joint swelling and myalgias.  Skin:  Negative for rash.  Neurological:  Negative for dizziness, weakness and headaches.  Psychiatric/Behavioral:  Negative for dysphoric mood. The patient is not nervous/anxious.     Objective:  BP 132/78    Pulse 68  Temp (!) 96 F (35.6 C)    Resp 18    Ht 5\' 7"  (1.702 m)    Wt 214 lb 9.6 oz (97.3 kg)    BMI 33.61 kg/m   BP/Weight 07/20/2021 06/17/2021 9/38/1017  Systolic BP 510 258 527  Diastolic BP 78 90 93  Wt. (Lbs) 214.6 219 -  BMI 33.61 33.3 -    Physical Exam Vitals reviewed.  Constitutional:      Appearance: Normal appearance. He is normal weight.  Neck:     Vascular: No carotid bruit.  Cardiovascular:     Rate and Rhythm: Normal rate and regular rhythm.     Pulses: Normal pulses.     Heart sounds: Normal heart sounds.  Pulmonary:     Effort: Pulmonary effort is normal.     Breath sounds: Normal breath sounds.  Neurological:     Mental Status: He is alert and oriented to person, place, and time.   Psychiatric:        Mood and Affect: Mood normal.        Behavior: Behavior normal.    Diabetic Foot Exam - Simple   No data filed      Lab Results  Component Value Date   WBC 4.3 06/18/2021   HGB 13.9 06/18/2021   HCT 42.0 06/18/2021   PLT 170 06/18/2021   GLUCOSE 112 (H) 06/18/2021   CHOL 164 06/18/2021   TRIG 52 06/18/2021   HDL 45 06/18/2021   LDLCALC 108 (H) 06/18/2021   ALT 14 06/18/2021   AST 15 06/18/2021   NA 137 06/18/2021   K 4.5 06/18/2021   CL 102 06/18/2021   CREATININE 0.89 06/18/2021   BUN 16 06/18/2021   CO2 23 06/18/2021   TSH 1.390 06/18/2021   HGBA1C 6.1 (H) 06/18/2021   Assessment & Plan:   Problem List Items Addressed This Visit       Cardiovascular and Mediastinum   Essential hypertension, benign - Primary    Well controlled.  Continue to work on eating a healthy diet and exercise.           Other   Class 1 obesity without serious comorbidity with body mass index (BMI) of 33.0 to 33.9 in adult    Recommend continue to work on eating healthy diet and exercise.       Prediabetes    Low sugar diet.       Need for Streptococcus pneumoniae vaccination   Relevant Orders   Pneumococcal conjugate vaccine 20-valent (Prevnar 20) (Completed)  . Orders Placed This Encounter  Procedures   Pneumococcal conjugate vaccine 20-valent (Prevnar 20)     Follow-up: Return in about 3 months (around 10/17/2021) for chronic fasting.  An After Visit Summary was printed and given to the patient.  Rochel Brome, MD [facility] [phone]

## 2021-07-20 ENCOUNTER — Ambulatory Visit (INDEPENDENT_AMBULATORY_CARE_PROVIDER_SITE_OTHER): Payer: PPO | Admitting: Family Medicine

## 2021-07-20 ENCOUNTER — Encounter: Payer: Self-pay | Admitting: Family Medicine

## 2021-07-20 ENCOUNTER — Other Ambulatory Visit: Payer: Self-pay

## 2021-07-20 VITALS — BP 132/78 | HR 68 | Temp 96.0°F | Resp 18 | Ht 67.0 in | Wt 214.6 lb

## 2021-07-20 DIAGNOSIS — R7303 Prediabetes: Secondary | ICD-10-CM | POA: Insufficient documentation

## 2021-07-20 DIAGNOSIS — E6609 Other obesity due to excess calories: Secondary | ICD-10-CM

## 2021-07-20 DIAGNOSIS — Z6833 Body mass index (BMI) 33.0-33.9, adult: Secondary | ICD-10-CM

## 2021-07-20 DIAGNOSIS — Z23 Encounter for immunization: Secondary | ICD-10-CM

## 2021-07-20 DIAGNOSIS — I10 Essential (primary) hypertension: Secondary | ICD-10-CM | POA: Diagnosis not present

## 2021-07-20 NOTE — Assessment & Plan Note (Signed)
Well controlled.  Continue to work on eating a healthy diet and exercise.   

## 2021-07-20 NOTE — Assessment & Plan Note (Signed)
Low sugar diet.

## 2021-07-20 NOTE — Patient Instructions (Signed)
Keep up good work!  

## 2021-07-20 NOTE — Assessment & Plan Note (Signed)
Recommend continue to work on eating healthy diet and exercise.  

## 2021-10-19 ENCOUNTER — Ambulatory Visit: Payer: PPO | Admitting: Family Medicine

## 2022-01-24 ENCOUNTER — Encounter: Payer: Self-pay | Admitting: Family Medicine

## 2022-01-25 ENCOUNTER — Encounter: Payer: Self-pay | Admitting: Family Medicine

## 2022-02-03 ENCOUNTER — Other Ambulatory Visit: Payer: Self-pay

## 2022-02-03 NOTE — Patient Outreach (Signed)
  Care Coordination   02/03/2022 Name: Yaniv Lage MRN: [id number] DOB: [birth year]   Care Coordination Outreach Attempts:  An unsuccessful telephone outreach was attempted today to offer the patient information about available care coordination services as a benefit of their health plan.   Follow Up Plan:  Additional outreach attempts will be made to offer the patient care coordination information and services.   Encounter Outcome:  No Answer  Care Coordination Interventions Activated:  No   Care Coordination Interventions:  No, not indicated    Tomasa Rand, RN, BSN, CEN [facility] [employer] [phone]

## 2022-02-07 ENCOUNTER — Other Ambulatory Visit: Payer: Self-pay

## 2022-02-07 ENCOUNTER — Encounter: Payer: Self-pay | Admitting: Family Medicine

## 2022-02-07 NOTE — Patient Outreach (Signed)
  Care Coordination   02/07/2022 Name: Johnathan Flynn MRN: [id number] DOB: 08/14/1950   Care Coordination Outreach Attempts:  A second unsuccessful outreach was attempted today to offer the patient with information about available care coordination services as a benefit of their health plan.     Follow Up Plan:  Additional outreach attempts will be made to offer the patient care coordination information and services.   Encounter Outcome:  No Answer  Care Coordination Interventions Activated:  No   Care Coordination Interventions:  No, not indicated    Tomasa Rand, RN, BSN, CEN [facility] [employer] [phone]

## 2022-03-25 ENCOUNTER — Ambulatory Visit (INDEPENDENT_AMBULATORY_CARE_PROVIDER_SITE_OTHER): Payer: PPO | Admitting: Family Medicine

## 2022-03-25 VITALS — BP 144/78 | HR 72 | Temp 97.3°F | Resp 16 | Ht 67.5 in | Wt 228.0 lb

## 2022-03-25 DIAGNOSIS — R7303 Prediabetes: Secondary | ICD-10-CM | POA: Diagnosis not present

## 2022-03-25 DIAGNOSIS — N400 Enlarged prostate without lower urinary tract symptoms: Secondary | ICD-10-CM

## 2022-03-25 DIAGNOSIS — Z23 Encounter for immunization: Secondary | ICD-10-CM | POA: Diagnosis not present

## 2022-03-25 DIAGNOSIS — E782 Mixed hyperlipidemia: Secondary | ICD-10-CM | POA: Diagnosis not present

## 2022-03-25 DIAGNOSIS — R030 Elevated blood-pressure reading, without diagnosis of hypertension: Secondary | ICD-10-CM | POA: Diagnosis not present

## 2022-03-25 DIAGNOSIS — I6529 Occlusion and stenosis of unspecified carotid artery: Secondary | ICD-10-CM | POA: Diagnosis not present

## 2022-03-25 DIAGNOSIS — Z1159 Encounter for screening for other viral diseases: Secondary | ICD-10-CM | POA: Diagnosis not present

## 2022-03-25 NOTE — Progress Notes (Signed)
Subjective:  Patient ID: Johnathan Flynn, male    DOB: 1951/05/12  Age: 71 y.o. MRN: [id number]  Chief Complaint  Patient presents with   Hypertension   HPI Hypertension: not currently on bp medicines. Patient says he has white coat hypertension. Patient has not been monitoring his bp at home.   Prediabetes: intermittently eats healthy. Not exercising.  MOQ:[id number] plus. Not taking regularly. Helped some.  Patient is concerned about carotid stenosis. Had an ultrasound few years ago and it showed atherosclerosis in carotids.   Current Outpatient Medications on File Prior to Visit  Medication Sig Dispense Refill   Misc Natural Products (URINOZINC PROSTATE CLASSIC PO) Take by mouth.     aspirin EC 81 MG tablet Take 81 mg by mouth daily. Swallow whole.     Multiple Vitamin (MULTIVITAMIN) tablet Take 1 tablet by mouth daily.     omega-3 fish oil (MAXEPA) 1000 MG CAPS capsule Take by mouth.     No current facility-administered medications on file prior to visit.   Past Medical History:  Diagnosis Date   Hyperlipidemia    Hypertension    Prediabetes    No past surgical history on file.  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  Tobacco Use   Smoking status: Every Day    Types: Cigars   Smokeless tobacco: Not on file  Substance and Sexual Activity   Alcohol use: Not on file   Drug use: Not on file   Sexual activity: Yes    Comment: one partner  Other Topics Concern   Not on file  Social History Narrative   Widowed. Girlfriend.    Children:    Coffee 2-4 cups per day.    Social Determinants of Health   Financial Resource Strain: Low Risk   (06/17/2021)   Overall Financial Resource Strain (CARDIA)    Difficulty of Paying Living Expenses: Not hard at all  Food Insecurity: No Food Insecurity (06/17/2021)   Hunger Vital Sign    Worried About Running Out of Food in the Last Year: Never true    Ran Out of Food in the Last Year: Never true  Transportation Needs: No Transportation Needs (06/17/2021)   PRAPARE - Hydrologist (Medical): No    Lack of Transportation (Non-Medical): No  Physical Activity: Sufficiently Active (06/17/2021)   Exercise Vital Sign    Days of Exercise per Week: 3 days    Minutes of Exercise per Session: 60 min  Stress: Stress Concern Present (06/17/2021)   [place]    Feeling of Stress : To some extent  Social Connections: Not on file    Review of Systems  Constitutional:  Negative for chills and fever.  HENT:  Negative for congestion, rhinorrhea and sore throat.   Respiratory:  Positive for cough (resolving after 3 weeks). Negative for shortness of breath.   Cardiovascular:  Negative for chest pain and palpitations.  Gastrointestinal:  Negative for abdominal pain, constipation, diarrhea, nausea and vomiting.  Genitourinary:  Positive for difficulty urinating (early morning voiding). Negative for dysuria  and urgency.  Musculoskeletal:  Negative for arthralgias, back pain and myalgias.  Neurological:  Negative for dizziness and headaches.  Psychiatric/Behavioral:  Negative for dysphoric mood. The patient is not nervous/anxious.      Objective:  BP (!) 144/78   Pulse 72   Temp (!) 97.3 F (36.3 C)   Resp 16   Ht 5' 7.5" (1.715 m)   Wt 228 lb (103.4 kg)   BMI 35.18 kg/m      03/25/2022    9:31 AM 03/25/2022    9:05 AM 07/20/2021    9:43 AM  BP/Weight  Systolic BP 810 175 102  Diastolic BP 78 76 78  Wt. (Lbs)  228   BMI  35.18 kg/m2     Physical Exam Vitals reviewed.  Constitutional:       Appearance: Normal appearance. He is obese.  Neck:     Vascular: No carotid bruit.  Cardiovascular:     Rate and Rhythm: Normal rate and regular rhythm.     Heart sounds: Normal heart sounds.  Pulmonary:     Effort: Pulmonary effort is normal.     Breath sounds: Normal breath sounds. No wheezing, rhonchi or rales.  Abdominal:     General: Bowel sounds are normal.     Palpations: Abdomen is soft.     Tenderness: There is no abdominal tenderness.  Neurological:     Mental Status: He is alert and oriented to person, place, and time.  Psychiatric:        Mood and Affect: Mood normal.        Behavior: Behavior normal.     Diabetic Foot Exam - Simple   No data filed      Lab Results  Component Value Date   WBC 4.7 03/25/2022   HGB 14.1 03/25/2022   HCT 41.3 03/25/2022   PLT 155 03/25/2022   GLUCOSE 129 (H) 03/25/2022   CHOL 170 03/25/2022   TRIG 57 03/25/2022   HDL 56 03/25/2022   LDLCALC 103 (H) 03/25/2022   ALT 31 03/25/2022   AST 24 03/25/2022   NA 137 03/25/2022   K 4.9 03/25/2022   CL 101 03/25/2022   CREATININE 0.83 03/25/2022   BUN 24 03/25/2022   CO2 22 03/25/2022   TSH 1.780 03/25/2022   HGBA1C 6.0 (H) 03/25/2022      Assessment & Plan:   Problem List Items Addressed This Visit       Cardiovascular and Mediastinum   Stenosis of carotid artery    Check carotid US      Relevant Orders   US Carotid Bilateral (Completed)     Other   Prediabetes    Recommend continue to work on eating healthy diet and exercise.       Relevant Orders   Hemoglobin A1c (Completed)   Need for influenza vaccination - Primary   Relevant Orders   Flu Vaccine QUAD High Dose(Fluad) (Completed)   Need for hepatitis C screening test   Relevant Orders   HCV Ab w Reflex to Quant PCR (Completed)   Mixed hyperlipidemia    Continue to work on eating a healthy diet and exercise.  Labs drawn today.        Relevant Orders   CBC with Differential/Platelet (Completed)    Comprehensive metabolic panel (Completed)   Lipid panel (Completed)   Interpretation: (Completed)   Cardiovascular Risk Assessment (Completed)   Elevated BP without diagnosis of hypertension    Check bp at home.  Check  labs.       Relevant Orders   TSH (Completed)   Other Visit Diagnoses     Benign prostatic hyperplasia without lower urinary tract symptoms       Relevant Medications   Misc Natural Products (URINOZINC PROSTATE CLASSIC PO)     .  No orders of the defined types were placed in this encounter.   Orders Placed This Encounter  Procedures   US Carotid Bilateral   Flu Vaccine QUAD High Dose(Fluad)   CBC with Differential/Platelet   Comprehensive metabolic panel   Lipid panel   Hemoglobin A1c   TSH   HCV Ab w Reflex to Quant PCR   Interpretation:   Cardiovascular Risk Assessment     Follow-up: Return in about 6 weeks (around 05/06/2022) for chronic follow up.  An After Visit Summary was printed and given to the patient.  Rochel Brome, MD [facility] [phone]

## 2022-03-25 NOTE — Patient Instructions (Signed)
Ordering carotid ultrasound

## 2022-03-26 LAB — COMPREHENSIVE METABOLIC PANEL
ALT: 31 IU/L (ref 0–44)
AST: 24 IU/L (ref 0–40)
Albumin/Globulin Ratio: 1.9 (ref 1.2–2.2)
Albumin: 4.6 g/dL (ref 3.9–4.9)
Alkaline Phosphatase: 72 IU/L (ref 44–121)
BUN/Creatinine Ratio: 29 — ABNORMAL HIGH (ref 10–24)
BUN: 24 mg/dL (ref 8–27)
Bilirubin Total: 0.2 mg/dL (ref 0.0–1.2)
CO2: 22 mmol/L (ref 20–29)
Calcium: 10.5 mg/dL — ABNORMAL HIGH (ref 8.6–10.2)
Chloride: 101 mmol/L (ref 96–106)
Creatinine, Ser: 0.83 mg/dL (ref 0.76–1.27)
Globulin, Total: 2.4 g/dL (ref 1.5–4.5)
Glucose: 129 mg/dL — ABNORMAL HIGH (ref 70–99)
Potassium: 4.9 mmol/L (ref 3.5–5.2)
Sodium: 137 mmol/L (ref 134–144)
Total Protein: 7 g/dL (ref 6.0–8.5)
eGFR: 95 mL/min/{1.73_m2} (ref >59)

## 2022-03-26 LAB — CBC WITH DIFFERENTIAL/PLATELET
Basophils Absolute: 0.1 10*3/uL (ref 0.0–0.2)
Basos: 1 %
EOS (ABSOLUTE): 0.2 10*3/uL (ref 0.0–0.4)
Eos: 5 %
Hematocrit: 41.3 % (ref 37.5–51.0)
Hemoglobin: 14.1 g/dL (ref 13.0–17.7)
Immature Grans (Abs): 0 10*3/uL (ref 0.0–0.1)
Immature Granulocytes: 1 %
Lymphocytes Absolute: 1.2 10*3/uL (ref 0.7–3.1)
Lymphs: 24 %
MCH: 30.6 pg (ref 26.6–33.0)
MCHC: 34.1 g/dL (ref 31.5–35.7)
MCV: 90 fL (ref 79–97)
Monocytes Absolute: 0.6 10*3/uL (ref 0.1–0.9)
Monocytes: 13 %
Neutrophils Absolute: 2.7 10*3/uL (ref 1.4–7.0)
Neutrophils: 56 %
Platelets: 155 10*3/uL (ref 150–450)
RBC: 4.61 x10E6/uL (ref 4.14–5.80)
RDW: 13.7 % (ref 11.6–15.4)
WBC: 4.7 10*3/uL (ref 3.4–10.8)

## 2022-03-26 LAB — LIPID PANEL
Chol/HDL Ratio: 3 ratio (ref 0.0–5.0)
Cholesterol, Total: 170 mg/dL (ref 100–199)
HDL: 56 mg/dL (ref >39)
LDL Chol Calc (NIH): 103 mg/dL — ABNORMAL HIGH (ref 0–99)
Triglycerides: 57 mg/dL (ref 0–149)
VLDL Cholesterol Cal: 11 mg/dL (ref 5–40)

## 2022-03-26 LAB — HCV AB W REFLEX TO QUANT PCR: HCV Ab: NONREACTIVE

## 2022-03-26 LAB — HCV INTERPRETATION

## 2022-03-26 LAB — TSH: TSH: 1.78 u[IU]/mL (ref 0.450–4.500)

## 2022-03-26 LAB — HEMOGLOBIN A1C
Est. average glucose Bld gHb Est-mCnc: 126 mg/dL
Hgb A1c MFr Bld: 6 % — ABNORMAL HIGH (ref 4.8–5.6)

## 2022-03-26 LAB — CARDIOVASCULAR RISK ASSESSMENT

## 2022-03-27 NOTE — Progress Notes (Signed)
Blood count normal.  Liver function normal.  Kidney function normal.  Thyroid function normal.  Hep c negative.  Cholesterol: good. HBA1C: 6. Prediabetes.

## 2022-03-31 ENCOUNTER — Ambulatory Visit
Admission: RE | Admit: 2022-03-31 | Discharge: 2022-03-31 | Disposition: A | Discharge status: Home / Self Care | Payer: PPO | Source: Ambulatory Visit | Attending: Family Medicine | Admitting: Family Medicine

## 2022-03-31 DIAGNOSIS — I6523 Occlusion and stenosis of bilateral carotid arteries: Secondary | ICD-10-CM | POA: Diagnosis not present

## 2022-03-31 DIAGNOSIS — I6529 Occlusion and stenosis of unspecified carotid artery: Secondary | ICD-10-CM

## 2022-04-02 ENCOUNTER — Encounter: Payer: Self-pay | Admitting: Family Medicine

## 2022-04-02 DIAGNOSIS — Z1159 Encounter for screening for other viral diseases: Secondary | ICD-10-CM

## 2022-04-02 DIAGNOSIS — R030 Elevated blood-pressure reading, without diagnosis of hypertension: Secondary | ICD-10-CM | POA: Insufficient documentation

## 2022-04-02 DIAGNOSIS — Z23 Encounter for immunization: Secondary | ICD-10-CM | POA: Insufficient documentation

## 2022-04-02 DIAGNOSIS — E782 Mixed hyperlipidemia: Secondary | ICD-10-CM | POA: Insufficient documentation

## 2022-04-02 DIAGNOSIS — I6529 Occlusion and stenosis of unspecified carotid artery: Secondary | ICD-10-CM | POA: Insufficient documentation

## 2022-04-02 HISTORY — DX: Encounter for screening for other viral diseases: Z11.59

## 2022-04-02 NOTE — Assessment & Plan Note (Signed)
Check bp at home.  Check labs.

## 2022-04-02 NOTE — Assessment & Plan Note (Signed)
Recommend continue to work on eating healthy diet and exercise.  

## 2022-04-02 NOTE — Assessment & Plan Note (Signed)
Check carotid US

## 2022-04-02 NOTE — Assessment & Plan Note (Signed)
Continue to work on eating a healthy diet and exercise.  Labs drawn today.  

## 2022-05-10 ENCOUNTER — Ambulatory Visit: Payer: BLUE CROSS/BLUE SHIELD | Admitting: Family Medicine

## 2022-06-09 ENCOUNTER — Telehealth: Payer: Self-pay

## 2022-06-09 NOTE — Patient Outreach (Signed)
  Care Coordination   06/09/2022 Name: Johnathan Flynn MRN: [id number] DOB: Jul 23, 1950   Care Coordination Outreach Attempts:  A third unsuccessful outreach was attempted today to offer the patient with information about available care coordination services as a benefit of their health plan.   Follow Up Plan:  No further outreach attempts will be made at this time. We have been unable to contact the patient to offer or enroll patient in care coordination services  Encounter Outcome:  No Answer   Care Coordination Interventions:  No, not indicated    Tomasa Rand, RN, BSN, CEN [place] Coordinator [phone]

## 2022-08-12 DIAGNOSIS — H2513 Age-related nuclear cataract, bilateral: Secondary | ICD-10-CM | POA: Diagnosis not present

## 2022-09-05 DIAGNOSIS — H2512 Age-related nuclear cataract, left eye: Secondary | ICD-10-CM | POA: Diagnosis not present

## 2022-09-05 DIAGNOSIS — H2513 Age-related nuclear cataract, bilateral: Secondary | ICD-10-CM | POA: Diagnosis not present

## 2022-09-05 DIAGNOSIS — H25043 Posterior subcapsular polar age-related cataract, bilateral: Secondary | ICD-10-CM | POA: Diagnosis not present

## 2022-11-10 DIAGNOSIS — H2511 Age-related nuclear cataract, right eye: Secondary | ICD-10-CM | POA: Diagnosis not present

## 2022-11-10 DIAGNOSIS — H2512 Age-related nuclear cataract, left eye: Secondary | ICD-10-CM | POA: Diagnosis not present

## 2022-11-10 DIAGNOSIS — H2513 Age-related nuclear cataract, bilateral: Secondary | ICD-10-CM | POA: Diagnosis not present

## 2022-11-11 DIAGNOSIS — H2511 Age-related nuclear cataract, right eye: Secondary | ICD-10-CM | POA: Diagnosis not present

## 2022-12-01 DIAGNOSIS — H2511 Age-related nuclear cataract, right eye: Secondary | ICD-10-CM | POA: Diagnosis not present

## 2022-12-09 DIAGNOSIS — H2513 Age-related nuclear cataract, bilateral: Secondary | ICD-10-CM | POA: Diagnosis not present

## 2023-01-27 ENCOUNTER — Ambulatory Visit: Payer: PPO | Admitting: Family Medicine

## 2023-01-27 ENCOUNTER — Encounter: Payer: Self-pay | Admitting: Family Medicine

## 2023-01-27 VITALS — BP 154/88 | HR 53 | Temp 97.8°F | Ht 67.5 in | Wt 196.8 lb

## 2023-01-27 DIAGNOSIS — R7303 Prediabetes: Secondary | ICD-10-CM

## 2023-01-27 DIAGNOSIS — I10 Essential (primary) hypertension: Secondary | ICD-10-CM

## 2023-01-27 DIAGNOSIS — Z0000 Encounter for general adult medical examination without abnormal findings: Secondary | ICD-10-CM

## 2023-01-27 DIAGNOSIS — E782 Mixed hyperlipidemia: Secondary | ICD-10-CM | POA: Diagnosis not present

## 2023-01-27 DIAGNOSIS — N401 Enlarged prostate with lower urinary tract symptoms: Secondary | ICD-10-CM | POA: Diagnosis not present

## 2023-01-27 DIAGNOSIS — E6609 Other obesity due to excess calories: Secondary | ICD-10-CM

## 2023-01-27 DIAGNOSIS — Z6830 Body mass index (BMI) 30.0-30.9, adult: Secondary | ICD-10-CM

## 2023-01-27 DIAGNOSIS — R350 Frequency of micturition: Secondary | ICD-10-CM | POA: Diagnosis not present

## 2023-01-27 NOTE — Progress Notes (Unsigned)
Subjective:   Johnathan Flynn is a 72 y.o. male who presents for Medicare Annual/Subsequent preventive examination.  Visit Complete: {VISITMETHOD:[phone]}  Patient Medicare AWV questionnaire was completed by the patient on ***; I have confirmed that all information answered by patient is correct and no changes since this date. Patient states that he wears a seatbelt, sunscreen if needed, and does not own a gun.  Review of Systems    Review of Systems  Constitutional:  Negative for fever and malaise/fatigue.  HENT:  Negative for congestion and ear pain.   Respiratory:  Negative for cough and shortness of breath.   Cardiovascular:  Negative for chest pain.  Gastrointestinal:  Negative for abdominal pain, constipation and diarrhea.  Genitourinary:  Positive for frequency.  Musculoskeletal:  Negative for joint pain and myalgias.  Skin:  Negative for rash.       Bug bite right upper arm.  Neurological:  Negative for dizziness, weakness and headaches.  Psychiatric/Behavioral:  Negative for depression. The patient is not nervous/anxious.     Cardiac Risk Factors include: male gender     Objective:    Today's Vitals   01/27/23 1158  BP: (!) 154/88  Pulse: (!) 53  Temp: 97.8 F (36.6 C)  TempSrc: Temporal  SpO2: 96%  Weight: 196 lb 12.8 oz (89.3 kg)  Height: 5' 7.5" (1.715 m)   Body mass index is 30.37 kg/m.     06/17/2021   10:55 PM 12/23/2015    2:52 PM  Advanced Directives  Does Patient Have a Medical Advance Directive? No No  Would patient like information on creating a medical advance directive? Yes (MAU/Ambulatory/Procedural Areas - Information given) No - patient declined information    Current Medications (verified) Outpatient Encounter Medications as of 01/27/2023  Medication Sig   aspirin EC 81 MG tablet Take 81 mg by mouth daily. Swallow whole.   Multiple Vitamin (MULTIVITAMIN) tablet Take 1 tablet by mouth daily.   omega-3 fish oil (MAXEPA) 1000 MG CAPS  capsule Take by mouth.   Misc Natural Products (URINOZINC PROSTATE CLASSIC PO) Take by mouth.   No facility-administered encounter medications on file as of 01/27/2023.    Allergies (verified) Patient has no known allergies.   History: Past Medical History:  Diagnosis Date   Hyperlipidemia    Hypertension    Prediabetes    No past surgical history on file. 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  Tobacco Use   Smoking status: Every Day    Types: Cigars   Smokeless tobacco: Not on file  Substance and Sexual Activity   Alcohol use: Not on file   Drug use: Never   Sexual activity: Yes    Comment: one partner  Other Topics Concern   Not on file  Social History Narrative   Widowed. Girlfriend.    Children:    Coffee 2-4 cups per day.    Social Determinants of Health   Financial Resource Strain: Low Risk  (01/27/2023)   Overall Financial Resource Strain (CARDIA)    Difficulty of Paying Living Expenses: Not hard at all  Food Insecurity: No Food  Insecurity (01/27/2023)   Hunger Vital Sign    Worried About Running Out of Food in the Last Year: Never true    Ran Out of Food in the Last Year: Never true  Transportation Needs: No Transportation Needs (01/27/2023)   PRAPARE - Administrator, Civil Service (Medical): No    Lack of Transportation (Non-Medical): No  Physical Activity: Sufficiently Active (01/27/2023)   Exercise Vital Sign    Days of Exercise per Week: 3 days    Minutes of Exercise per Session: 60 min  Stress: No Stress Concern Present (01/27/2023)   [employer] of Occupational Health - Occupational Stress Questionnaire     Feeling of Stress : Not at all  Social Connections: Moderately Isolated (01/27/2023)   Social Connection and Isolation Panel [NHANES]    Frequency of Communication with Friends and Family: More than three times a week    Frequency of Social Gatherings with Friends and Family: More than three times a week    Attends Religious Services: More than 4 times per year    Active Member of [employer] or Organizations: No    Attends Banker Meetings: Never    Marital Status: Widowed    Tobacco Counseling Ready to quit: Not Answered Counseling given: Not Answered   Clinical Intake:              How often do you need to have someone help you when you read instructions, pamphlets, or other written materials from your doctor or pharmacy?: 1 - Never  Interpreter Needed?: No      Activities of Daily Living    01/27/2023   12:20 PM 01/24/2023    9:58 AM  In your present state of health, do you have any difficulty performing the following activities:  Hearing? 0 0  Vision? 0 0  Difficulty concentrating or making decisions? 0 0  Walking or climbing stairs? 0 0  Dressing or bathing? 0 0  Doing errands, shopping? 0 0  Preparing Food and eating ? N N  Using the Toilet? N N  In the past six months, have you accidently leaked urine? N N  Do you have problems with loss of bowel control? N N  Managing your Medications? N N  Managing your Finances? N N  Housekeeping or managing your Housekeeping? N N    Patient Care Team: CoxFritzi Mandes, MD as PCP - General (Internal Medicine)  Indicate any recent Medical Services you may have received from other than [facility] providers in the past year (date may be approximate).     Assessment:   This is a routine wellness examination for Johnathan Flynn.  Hearing/Vision screen No results found.  Dietary issues and exercise activities discussed:     Goals Addressed   None   Depression Screen    01/27/2023   12:02 PM 06/17/2021    3:12 PM  PHQ  2/9 Scores  PHQ - 2 Score 0 0  PHQ- 9 Score 3     Fall Risk    01/27/2023   12:02 PM 01/24/2023    9:58 AM 06/17/2021    3:12 PM  Fall Risk   Falls in the past year? 0 0   Number falls in past yr: 0 0 0  Injury with Fall? 0 0 0  Risk for fall due to : No Fall Risks    Follow up Falls evaluation completed  Falls evaluation completed    MEDICARE RISK AT HOME:   TIMED UP  AND GO:  Was the test performed?  {[phone]}    Cognitive Function:        Immunizations Immunization History  Administered Date(s) Administered   Fluad Quad(high Dose 65+) 03/25/2022   Influenza-Unspecified 05/27/2021   Moderna Covid-19 Vaccine Bivalent Booster 55yrs & up 06/17/2021   Moderna Sars-Covid-2 Vaccination 08/30/2019, 09/26/2019   PNEUMOCOCCAL CONJUGATE-20 07/20/2021   Tdap 07/15/2022   Zoster Recombinant(Shingrix) 07/15/2022, 01/10/2023    TDAP status: Up to date  Flu Vaccine status: Up to date  Pneumococcal vaccine status: Up to date  Covid-19 vaccine status: Declined, Education has been provided regarding the importance of this vaccine but patient still declined. Advised may receive this vaccine at local pharmacy or Health Dept.or vaccine clinic. Aware to provide a copy of the vaccination record if obtained from local pharmacy or Health Dept. Verbalized acceptance and understanding.  Qualifies for Shingles Vaccine? Yes    Shingrix Completed?: Yes  Screening Tests Health Maintenance  Topic Date Due   INFLUENZA VACCINE  01/12/2023   COVID-19 Vaccine (4 - 2023-24 season) 02/12/2023 (Originally 02/11/2022)   Colonoscopy  01/14/2024   Medicare Annual Wellness (AWV)  01/27/2024   DTaP/Tdap/Td (2 - Td or Tdap) 07/15/2032   Pneumonia Vaccine 55+ Years old  Completed   Hepatitis C Screening  Completed   Zoster Vaccines- Shingrix  Completed   HPV VACCINES  Aged Out    Health Maintenance  Health Maintenance Due  Topic Date Due   INFLUENZA VACCINE  01/12/2023     Colorectal cancer screening: Type of screening: Colonoscopy. Completed 2020. Repeat every 5 years  Lung Cancer Screening: (Low Dose CT Chest recommended if Age 70-80 years, 20 pack-year currently smoking OR have quit w/in 15years.) does not qualify.    Additional Screening:  Hepatitis C Screening: {DOES NOT does:27190::"does not"} qualify; Completed ***  Vision Screening: Recommended annual ophthalmology exams for early detection of glaucoma and other disorders of the eye. Is the patient up to date with their annual eye exam?  {YES/NO:21197} Who is the provider or what is the name of the office in which the patient attends annual eye exams? *** If pt is not established with a provider, would they like to be referred to a provider to establish care? {YES/NO:21197}.   Dental Screening: Recommended annual dental exams for proper oral hygiene  Diabetic Foot Exam: {Diabetic Foot Exam:[id number]}  Community Resource Referral / Chronic Care Management: CRR required this visit?  {YES/NO:21197}  CCM required this visit?  {CCM Required choices:[id number]}     Plan:     I have personally reviewed and noted the following in the patient's chart:   Medical and social history Use of alcohol, tobacco or illicit drugs  Current medications and supplements including opioid prescriptions. {Opioid Prescriptions:[phone]} Functional ability and status Nutritional status Physical activity Advanced directives List of other physicians Hospitalizations, surgeries, and ER visits in previous 12 months Vitals Screenings to include cognitive, depression, and falls Referrals and appointments  In addition, I have reviewed and discussed with patient certain preventive protocols, quality metrics, and best practice recommendations. A written personalized care plan for preventive services as well as general preventive health recommendations were provided to patient.     I,Lauren M Auman,acting as a  scribe for Blane Ohara, MD.,have documented all relevant documentation on the behalf of Blane Ohara, MD,as directed by  Blane Ohara, MD while in the presence of Blane Ohara, MD.   Precious Reel, CMA   01/27/2023   After Visit Summary: {CHL  AMB AWV After Visit Summary:[phone]}  Nurse Notes: ***

## 2023-01-28 LAB — COMPREHENSIVE METABOLIC PANEL
ALT: 13 IU/L (ref 0–44)
AST: 17 IU/L (ref 0–40)
Albumin: 4.6 g/dL (ref 3.9–4.9)
Alkaline Phosphatase: 62 IU/L (ref 44–121)
BUN/Creatinine Ratio: 26 — ABNORMAL HIGH (ref 10–24)
BUN: 24 mg/dL (ref 8–27)
Bilirubin Total: 0.7 mg/dL (ref 0.0–1.2)
CO2: 25 mmol/L (ref 20–29)
Calcium: 10.4 mg/dL — ABNORMAL HIGH (ref 8.6–10.2)
Chloride: 103 mmol/L (ref 96–106)
Creatinine, Ser: 0.92 mg/dL (ref 0.76–1.27)
Globulin, Total: 2.2 g/dL (ref 1.5–4.5)
Glucose: 106 mg/dL — ABNORMAL HIGH (ref 70–99)
Potassium: 4.5 mmol/L (ref 3.5–5.2)
Sodium: 140 mmol/L (ref 134–144)
Total Protein: 6.8 g/dL (ref 6.0–8.5)
eGFR: 89 mL/min/{1.73_m2} (ref >59)

## 2023-01-28 LAB — CBC WITH DIFFERENTIAL/PLATELET
Basophils Absolute: 0 10*3/uL (ref 0.0–0.2)
Basos: 1 %
EOS (ABSOLUTE): 0.1 10*3/uL (ref 0.0–0.4)
Eos: 4 %
Hematocrit: 42.7 % (ref 37.5–51.0)
Hemoglobin: 14.1 g/dL (ref 13.0–17.7)
Immature Grans (Abs): 0 10*3/uL (ref 0.0–0.1)
Immature Granulocytes: 0 %
Lymphocytes Absolute: 1.2 10*3/uL (ref 0.7–3.1)
Lymphs: 34 %
MCH: 30.9 pg (ref 26.6–33.0)
MCHC: 33 g/dL (ref 31.5–35.7)
MCV: 94 fL (ref 79–97)
Monocytes Absolute: 0.5 10*3/uL (ref 0.1–0.9)
Monocytes: 13 %
Neutrophils Absolute: 1.7 10*3/uL (ref 1.4–7.0)
Neutrophils: 48 %
Platelets: 151 10*3/uL (ref 150–450)
RBC: 4.56 x10E6/uL (ref 4.14–5.80)
RDW: 12.9 % (ref 11.6–15.4)
WBC: 3.6 10*3/uL (ref 3.4–10.8)

## 2023-01-28 LAB — LIPID PANEL
Chol/HDL Ratio: 3.2 ratio (ref 0.0–5.0)
Cholesterol, Total: 180 mg/dL (ref 100–199)
HDL: 57 mg/dL (ref >39)
LDL Chol Calc (NIH): 111 mg/dL — ABNORMAL HIGH (ref 0–99)
Triglycerides: 61 mg/dL (ref 0–149)
VLDL Cholesterol Cal: 12 mg/dL (ref 5–40)

## 2023-01-28 LAB — TSH: TSH: 1.21 u[IU]/mL (ref 0.450–4.500)

## 2023-01-28 LAB — HEMOGLOBIN A1C
Est. average glucose Bld gHb Est-mCnc: 114 mg/dL
Hgb A1c MFr Bld: 5.6 % (ref 4.8–5.6)

## 2023-01-28 LAB — PSA: Prostate Specific Ag, Serum: 2.4 ng/mL (ref 0.0–4.0)

## 2023-01-29 ENCOUNTER — Encounter: Payer: Self-pay | Admitting: Family Medicine

## 2023-01-29 DIAGNOSIS — Z0000 Encounter for general adult medical examination without abnormal findings: Secondary | ICD-10-CM | POA: Insufficient documentation

## 2023-01-29 NOTE — Assessment & Plan Note (Signed)
Recommend continue to work on eating healthy diet and exercise.  

## 2023-01-29 NOTE — Assessment & Plan Note (Signed)
Not at goal.  Await labs/testing for assessment and recommendations.

## 2023-01-29 NOTE — Assessment & Plan Note (Signed)
Urinozinc for prostate hypertrophy.

## 2023-01-29 NOTE — Assessment & Plan Note (Signed)
Continue to work on eating a healthy diet and exercise.  Labs drawn today.  

## 2023-01-29 NOTE — Assessment & Plan Note (Signed)

## 2023-01-30 ENCOUNTER — Other Ambulatory Visit: Payer: Self-pay | Admitting: Family Medicine

## 2023-01-30 ENCOUNTER — Encounter: Payer: Self-pay | Admitting: Family Medicine

## 2023-01-30 ENCOUNTER — Telehealth: Payer: Self-pay

## 2023-01-30 DIAGNOSIS — I10 Essential (primary) hypertension: Secondary | ICD-10-CM

## 2023-01-30 MED ORDER — LOSARTAN POTASSIUM 50 MG PO TABS: 50.0000 mg | ORAL_TABLET | Freq: Every day | ORAL | 0 refills | Status: DC

## 2023-01-30 NOTE — Telephone Encounter (Signed)
Patient was in the office this past Friday and discussed starting a new BP medication.  Please send it to [facility] in [place].

## 2023-03-06 ENCOUNTER — Ambulatory Visit: Payer: BLUE CROSS/BLUE SHIELD | Admitting: Family Medicine

## 2023-05-21 DIAGNOSIS — M25512 Pain in left shoulder: Secondary | ICD-10-CM | POA: Diagnosis not present

## 2023-05-21 DIAGNOSIS — S46012A Strain of muscle(s) and tendon(s) of the rotator cuff of left shoulder, initial encounter: Secondary | ICD-10-CM | POA: Diagnosis not present

## 2023-06-02 ENCOUNTER — Ambulatory Visit: Payer: PPO | Admitting: Family Medicine

## 2023-06-02 ENCOUNTER — Encounter: Payer: Self-pay | Admitting: Family Medicine

## 2023-06-02 VITALS — BP 136/72 | HR 51 | Temp 97.1°F | Ht 67.5 in | Wt 210.0 lb

## 2023-06-02 DIAGNOSIS — M5412 Radiculopathy, cervical region: Secondary | ICD-10-CM | POA: Insufficient documentation

## 2023-06-02 DIAGNOSIS — M79602 Pain in left arm: Secondary | ICD-10-CM

## 2023-06-02 DIAGNOSIS — I10 Essential (primary) hypertension: Secondary | ICD-10-CM | POA: Diagnosis not present

## 2023-06-02 MED ORDER — LOSARTAN POTASSIUM 50 MG PO TABS: 50.0000 mg | ORAL_TABLET | Freq: Every day | ORAL | 0 refills | Status: DC

## 2023-06-02 MED ORDER — CELECOXIB 200 MG PO CAPS: 200.0000 mg | ORAL_CAPSULE | Freq: Every day | ORAL | 0 refills | Status: DC

## 2023-06-02 NOTE — Progress Notes (Signed)
Acute Office Visit  Subjective:    Patient ID: Johnathan Flynn, male    DOB: 08-25-1950, 72 y.o.   MRN: [id number]  Chief Complaint  Patient presents with   Arm Pain    HPI: Patient is in today for left arm pain x 3 weeks, describes pain as aching from elbow to hand at times radiates up into his shoulder. States he had been doing yard work for several days (blowing leaves) has tried muscle relaxants (his girlfriend rx) ibuprofen and tylenol which had not helped. Seen [facility] 12/8  which rx's him methylprednisone 4 mg and tizanidine 2 mg  which have not helped (he also did not stop doing yard work). Heating pad/Massager has not helped either. Larey Seat on his left arm a few nights ago (tripped in yard--while trying to low leaves). Pain comes and goes.  Past Medical History:  Diagnosis Date   Hyperlipidemia    Hypertension    Need for hepatitis C screening test 04/02/2022   Prediabetes     History reviewed. No pertinent surgical history.  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  Tobacco Use   Smoking status: Every Day    Types: Cigars   Smokeless tobacco: Not on file  Substance and Sexual Activity   Alcohol use: Not on file   Drug use: Never   Sexual activity: Yes    Comment: one partner  Other Topics Concern   Not on file  Social History Narrative   Widowed. Girlfriend.    Children:    Coffee 2-4 cups per day.    Social Drivers of Corporate investment banker Strain: Low Risk  (03/03/2023)   Overall Financial Resource Strain (CARDIA)    Difficulty of Paying Living Expenses: Not hard at all  Food Insecurity: No Food Insecurity (03/03/2023)   Hunger Vital  Sign    Worried About Running Out of Food in the Last Year: Never true    Ran Out of Food in the Last Year: Never true  Transportation Needs: No Transportation Needs (03/03/2023)   PRAPARE - Administrator, Civil Service (Medical): No    Lack of Transportation (Non-Medical): No  Physical Activity: Insufficiently Active (03/03/2023)   Exercise Vital Sign    Days of Exercise per Week: 4 days    Minutes of Exercise per Session: 20 min  Stress: Stress Concern Present (03/03/2023)   [employer] of Occupational Health - Occupational Stress Questionnaire    Feeling of Stress : To some extent  Social Connections: Moderately Isolated (03/03/2023)   Social Connection and Isolation Panel [NHANES]    Frequency of Communication with Friends and Family: More than three times a week    Frequency of Social Gatherings with Friends and Family: Once a week    Attends Religious Services: 1 to 4 times per year    Active Member of [employer] or Organizations: No    Attends Banker Meetings: Never    Marital Status: Widowed  Intimate Partner Violence: Not At Risk (01/27/2023)   Humiliation, Afraid, Rape, and Kick questionnaire    Fear of Current or  Ex-Partner: No    Emotionally Abused: No    Physically Abused: No    Sexually Abused: No    Outpatient Medications Prior to Visit  Medication Sig Dispense Refill   aspirin EC 81 MG tablet Take 81 mg by mouth daily. Swallow whole.     Multiple Vitamin (MULTIVITAMIN) tablet Take 1 tablet by mouth daily.     omega-3 fish oil (MAXEPA) 1000 MG CAPS capsule Take by mouth.     losartan (COZAAR) 50 MG tablet Take 1 tablet (50 mg total) by mouth daily. 90 tablet 0   No facility-administered medications prior to visit.    No Known Allergies  Review of Systems  Musculoskeletal:  Positive for arthralgias (left shoulder pain) and myalgias.       Objective:        06/02/2023   10:30 AM 01/27/2023   11:58 AM 03/25/2022    9:31 AM   Vitals with BMI  Height 5' 7.5" 5' 7.5"   Weight 210 lbs 196 lbs 13 oz   BMI 32.39 30.35   Systolic 136 154 161  Diastolic 72 88 78  Pulse 51 53     No data found.   Physical Exam Vitals reviewed.  Constitutional:      Appearance: Normal appearance.  Musculoskeletal:     Comments: LEFT SHOULDER EXAM TENDER: left medial distal upper arm.  FROM NORMAL (SHOULDER/ELBOW/WRIST)   Neurological:     Mental Status: He is alert.     Health Maintenance Due  Topic Date Due   COVID-19 Vaccine (4 - 2024-25 season) 02/12/2023    There are no preventive care reminders to display for this patient.   Lab Results  Component Value Date   TSH 1.210 01/27/2023   Lab Results  Component Value Date   WBC 3.6 01/27/2023   HGB 14.1 01/27/2023   HCT 42.7 01/27/2023   MCV 94 01/27/2023   PLT 151 01/27/2023   Lab Results  Component Value Date   NA 140 01/27/2023   K 4.5 01/27/2023   CO2 25 01/27/2023   GLUCOSE 106 (H) 01/27/2023   BUN 24 01/27/2023   CREATININE 0.92 01/27/2023   BILITOT 0.7 01/27/2023   ALKPHOS 62 01/27/2023   AST 17 01/27/2023   ALT 13 01/27/2023   PROT 6.8 01/27/2023   ALBUMIN 4.6 01/27/2023   CALCIUM 10.4 (H) 01/27/2023   EGFR 89 01/27/2023   Lab Results  Component Value Date   CHOL 180 01/27/2023   Lab Results  Component Value Date   HDL 57 01/27/2023   Lab Results  Component Value Date   LDLCALC 111 (H) 01/27/2023   Lab Results  Component Value Date   TRIG 61 01/27/2023   Lab Results  Component Value Date   CHOLHDL 3.2 01/27/2023   Lab Results  Component Value Date   HGBA1C 5.6 01/27/2023       Assessment & Plan:  Left arm pain Assessment & Plan: Muscular strain vs tendonitis. No evidence of rotator cuff impingement, epicondylitis, or biceps rupture.  Recommend start celebrex 200 mg once daily. Recommend acetaminophen (tylenol) for break through pain.  Recommend epsom salt bath.   Recommend Rest your arm. Rest for at least 2  weeks.    Essential hypertension, benign Assessment & Plan: The current medical regimen is effective;  continue present plan and medications. Continue losartan.   Orders: -     Losartan Potassium; Take 1 tablet (50 mg total) by mouth daily.  Dispense: 90 tablet;  Refill: 0  Other orders -     Celecoxib; Take 1 capsule (200 mg total) by mouth daily.  Dispense: 30 capsule; Refill: 0     Meds ordered this encounter  Medications   celecoxib (CELEBREX) 200 MG capsule    Sig: Take 1 capsule (200 mg total) by mouth daily.    Dispense:  30 capsule    Refill:  0   losartan (COZAAR) 50 MG tablet    Sig: Take 1 tablet (50 mg total) by mouth daily.    Dispense:  90 tablet    Refill:  0    No orders of the defined types were placed in this encounter.    Follow-up: Return in about 4 weeks (around 06/30/2023) for [place] Levo for follow up left arm pain and for cholesterol..  An After Visit Summary was printed and given to the patient.  Blane Ohara, MD [facility] [phone]

## 2023-06-02 NOTE — Patient Instructions (Addendum)
Recommend start celebrex 200 mg once daily.   Recommend acetaminophen (tylenol) for break through pain   Recommend epsom salt bath.    Recommend Rest your arm. Stop for at least 2 weeks.

## 2023-06-02 NOTE — Assessment & Plan Note (Signed)
Muscular strain vs tendonitis. No evidence of rotator cuff impingement, epicondylitis, or biceps rupture.  Recommend start celebrex 200 mg once daily. Recommend acetaminophen (tylenol) for break through pain.  Recommend epsom salt bath.   Recommend Rest your arm. Rest for at least 2 weeks.

## 2023-06-02 NOTE — Assessment & Plan Note (Signed)
The current medical regimen is effective;  continue present plan and medications. ?Continue losartan ?

## 2023-06-05 ENCOUNTER — Encounter: Payer: Self-pay | Admitting: Family Medicine

## 2023-06-29 ENCOUNTER — Ambulatory Visit (INDEPENDENT_AMBULATORY_CARE_PROVIDER_SITE_OTHER): Payer: PPO | Admitting: Family Medicine

## 2023-06-29 ENCOUNTER — Encounter: Payer: Self-pay | Admitting: Family Medicine

## 2023-06-29 ENCOUNTER — Other Ambulatory Visit: Payer: Self-pay | Admitting: Family Medicine

## 2023-06-29 VITALS — BP 178/98 | HR 60 | Temp 97.8°F | Ht 67.5 in | Wt 222.0 lb

## 2023-06-29 DIAGNOSIS — M79602 Pain in left arm: Secondary | ICD-10-CM

## 2023-06-29 DIAGNOSIS — I10 Essential (primary) hypertension: Secondary | ICD-10-CM

## 2023-06-29 DIAGNOSIS — E782 Mixed hyperlipidemia: Secondary | ICD-10-CM | POA: Diagnosis not present

## 2023-06-29 NOTE — Assessment & Plan Note (Signed)
Likely due to overexertion. Pain is improving but still present. No imaging done. Possible nerve involvement. -Referral to sports medicine for further evaluation and management. -Continue rest, support, and anti-inflammatories as tolerated. -Consider heat therapy with caution to avoid burns.

## 2023-06-29 NOTE — Assessment & Plan Note (Signed)
Continue to work on eating a healthy diet and exercise.  Labs drawn today.  

## 2023-06-29 NOTE — Assessment & Plan Note (Signed)
Blood pressure elevated during visit. Patient admits to inconsistent medication adherence. -Encouraged consistent daily intake of antihypertensive medication in the afternoon with food. -Recheck blood pressure after EKG.

## 2023-06-29 NOTE — Progress Notes (Signed)
Subjective:  Patient ID: Johnathan Flynn, male    DOB: 12-12-1950  Age: 73 y.o. MRN: [id number]  Chief Complaint  Patient presents with   Hyperlipidemia    4 week follow up for cholesterol and left arm pain per Dr. Sedalia Muta   HPI The patient, with a history of hypertension, presents with left arm pain that started about a month ago. He believes the pain is due to overexertion from leaf blowing. The pain, initially severe, has been improving but is still present. It is described as a dull ache, varying in location but often in the forearm. The patient has tried various treatments including steroids, Celebrex, heat, and ice, with temporary relief. He also mentions a brief episode of chest discomfort, described as a weak pressure lasting a couple of seconds.   Hypertension: Patient has not been taking his losartan consecutively each daily as prescribed. The patient also reports high blood pressure, which he attributes to stress, smoking cigars, and drinking coffee. He mentions a lack of regular exercise and a diet high in junk food as potential contributing factors. Patient denies shortness of breath but admits to one episode of chest pain and felt more like an uncomfortable pressure.near the left upper chest area. He has to find a place to move in 1 1/2 months and is stressed about the upcoming move.       01/27/2023   12:02 PM 06/17/2021    3:12 PM  Depression screen PHQ 2/9  Decreased Interest 0 0  Down, Depressed, Hopeless 0 0  PHQ - 2 Score 0 0  Altered sleeping 2   Tired, decreased energy 1   Change in appetite 0   Feeling bad or failure about yourself  0   Trouble concentrating 0   Moving slowly or fidgety/restless 0   Suicidal thoughts 0   PHQ-9 Score 3   Difficult doing work/chores Not difficult at all         01/27/2023   12:02 PM  Fall Risk   Falls in the past year? 0  Number falls in past yr: 0  Injury with Fall? 0  Risk for fall due to : No Fall Risks  Follow up Falls  evaluation completed    Patient Care Team: Blane Ohara, MD as PCP - General (Internal Medicine)   Review of Systems  Constitutional:  Negative for appetite change, fatigue and fever.  HENT:  Negative for congestion, ear pain, sinus pressure and sore throat.   Respiratory:  Negative for cough, chest tightness, shortness of breath and wheezing.   Cardiovascular:  Positive for chest pain (pressure on the left). Negative for palpitations.  Gastrointestinal:  Negative for abdominal pain, constipation, diarrhea, nausea and vomiting.  Genitourinary:  Negative for dysuria and hematuria.  Musculoskeletal:  Positive for arthralgias and myalgias (Left arm pain.). Negative for back pain and joint swelling.  Skin:  Negative for rash.  Neurological:  Negative for dizziness, weakness and numbness.  Psychiatric/Behavioral:  Negative for dysphoric mood. The patient is nervous/anxious (getting ready to move - stressor).     Current Outpatient Medications on File Prior to Visit  Medication Sig Dispense Refill   losartan (COZAAR) 50 MG tablet Take 1 tablet (50 mg total) by mouth daily. 90 tablet 0   MISC NATURAL PRODUCTS PO Take by mouth. URINOZINC PROSTATE CLASSIC PO 1 tablet once daily.     Multiple Vitamin (MULTIVITAMIN) tablet Take 1 tablet by mouth daily.     omega-3 fish oil (MAXEPA) 1000 MG  CAPS capsule Take by mouth.     aspirin EC 81 MG tablet Take 81 mg by mouth daily. Swallow whole. (Patient not taking: Reported on 06/29/2023)     No current facility-administered medications on file prior to visit.   Past Medical History:  Diagnosis Date   Hyperlipidemia    Hypertension    Need for hepatitis C screening test 04/02/2022   Prediabetes    History reviewed. No pertinent surgical history.  Family History  Problem Relation Age of Onset   Alcoholism Paternal Grandfather    Social History   Socioeconomic History   Marital status: Widowed    Spouse name: Not on file   Number of children:  Not on file   Years of education: Not on file   Highest education level: 12th grade  Occupational History    Comment: Retired. Used to be Merchandiser, retail in [place].  Tobacco Use   Smoking status: Every Day    Types: Cigars   Smokeless tobacco: Not on file  Substance and Sexual Activity   Alcohol use: Not on file   Drug use: Never   Sexual activity: Yes    Comment: one partner  Other Topics Concern   Not on file  Social History Narrative   Widowed. Girlfriend.    Children:    Coffee 2-4 cups per day.    Social Drivers of Corporate investment banker Strain: Low Risk  (06/28/2023)   Overall Financial Resource Strain (CARDIA)    Difficulty of Paying Living Expenses: Not hard at all  Food Insecurity: No Food Insecurity (06/28/2023)   Hunger Vital Sign    Worried About Running Out of Food in the Last Year: Never true    Ran Out of Food in the Last Year: Never true  Transportation Needs: No Transportation Needs (06/28/2023)   PRAPARE - Administrator, Civil Service (Medical): No    Lack of Transportation (Non-Medical): No  Physical Activity: Unknown (06/28/2023)   Exercise Vital Sign    Days of Exercise per Week: 2 days    Minutes of Exercise per Session: Patient declined  Stress: No Stress Concern Present (06/28/2023)   [employer] of Occupational Health - Occupational Stress Questionnaire    Feeling of Stress : Not at all  Social Connections: Unknown (06/28/2023)   Social Connection and Isolation Panel [NHANES]    Frequency of Communication with Friends and Family: More than three times a week    Frequency of Social Gatherings with Friends and Family: Twice a week    Attends Religious Services: Patient declined    Database administrator or Organizations: No    Attends Banker Meetings: Never    Marital Status: Widowed    Objective:  BP (!) 178/98 (BP Location: Left Arm, Patient Position: Sitting)   Pulse 60   Temp 97.8 F (36.6 C) (Temporal)   Ht  5' 7.5" (1.715 m)   Wt 222 lb (100.7 kg)   SpO2 97%   BMI 34.26 kg/m      06/29/2023    1:33 PM 06/02/2023   10:30 AM 01/27/2023   11:58 AM  BP/Weight  Systolic BP 178 136 154  Diastolic BP 98 72 88  Wt. (Lbs) 222 210 196.8  BMI 34.26 kg/m2 32.41 kg/m2 30.37 kg/m2    Physical Exam Vitals reviewed.  Constitutional:      General: He is not in acute distress.    Appearance: Normal appearance.  Eyes:  Conjunctiva/sclera: Conjunctivae normal.  Cardiovascular:     Rate and Rhythm: Normal rate and regular rhythm.     Heart sounds: Normal heart sounds. No murmur heard. Pulmonary:     Effort: Pulmonary effort is normal.     Breath sounds: Normal breath sounds. No wheezing.  Musculoskeletal:     Right upper arm: Normal.     Left upper arm: Tenderness present. No swelling, edema or deformity.  Neurological:     Mental Status: He is alert. Mental status is at baseline.  Psychiatric:        Mood and Affect: Mood normal.        Behavior: Behavior normal.     Lab Results  Component Value Date   WBC 3.6 01/27/2023   HGB 14.1 01/27/2023   HCT 42.7 01/27/2023   PLT 151 01/27/2023   GLUCOSE 106 (H) 01/27/2023   CHOL 180 01/27/2023   TRIG 61 01/27/2023   HDL 57 01/27/2023   LDLCALC 111 (H) 01/27/2023   ALT 13 01/27/2023   AST 17 01/27/2023   NA 140 01/27/2023   K 4.5 01/27/2023   CL 103 01/27/2023   CREATININE 0.92 01/27/2023   BUN 24 01/27/2023   CO2 25 01/27/2023   TSH 1.210 01/27/2023   HGBA1C 5.6 01/27/2023      Assessment & Plan:    Mixed hyperlipidemia Assessment & Plan: Continue to work on eating a healthy diet and exercise.  Labs drawn today.     Left arm pain Assessment & Plan: Likely due to overexertion. Pain is improving but still present. No imaging done. Possible nerve involvement. -Referral to sports medicine for further evaluation and management. -Continue rest, support, and anti-inflammatories as tolerated. -Consider heat therapy with  caution to avoid burns.   Orders: -     Ambulatory referral to Sports Medicine  Essential hypertension, benign Assessment & Plan: Blood pressure elevated during visit. Patient admits to inconsistent medication adherence. -Encouraged consistent daily intake of antihypertensive medication in the afternoon with food. -Recheck blood pressure after EKG.   Orders: -     EKG 12-Lead     No orders of the defined types were placed in this encounter.   Orders Placed This Encounter  Procedures   Ambulatory referral to Sports Medicine   EKG 12-Lead     Follow-up: No follow-ups on file.    An After Visit Summary was printed and given to the patient.  Total time spent on today's visit was 35 minutes, including both face-to-face time and nonface-to-face time personally spent on review of chart (labs and imaging), discussing labs and goals, discussing further work-up, treatment options, referrals to specialist if needed, reviewing outside records if pertinent, answering patient's questions, and coordinating care.    Lajuana Matte, FNP [facility] [phone]

## 2023-06-29 NOTE — Patient Instructions (Signed)
Mediterranean Diet A Mediterranean diet is based on the traditions of countries on the [employer]. It focuses on eating more: Fruits and vegetables. Whole grains, beans, nuts, and seeds. Heart-healthy fats. These are fats that are good for your heart. It involves eating less: Dairy. Meat and eggs. Processed foods with added sugar, salt, and fat. This type of diet can help prevent certain conditions. It can also improve outcomes if you have a long-term (chronic) disease, such as kidney or heart disease. What are tips for following this plan? Reading food labels Check packaged foods for: The serving size. For foods such as rice and pasta, the serving size is the amount of cooked product, not dry. The total fat. Avoid foods with saturated fat or trans fat. Added sugars, such as corn syrup. Shopping  Try to have a balanced diet. Buy a variety of foods, such as: Fresh fruits and vegetables. You may be able to get these from local farmers markets. You can also buy them frozen. Grains, beans, nuts, and seeds. Some of these can be bought in bulk. Fresh seafood. Poultry and eggs. Low-fat dairy products. Buy whole ingredients instead of foods that have already been packaged. If you can't get fresh seafood, buy precooked frozen shrimp or canned fish, such as tuna, salmon, or sardines. Stock your pantry so you always have certain foods on hand, such as olive oil, canned tuna, canned tomatoes, rice, pasta, and beans. Cooking Cook foods with extra-virgin olive oil instead of using butter or other vegetable oils. Have meat as a side dish. Have vegetables or grains as your main dish. This means having meat in small portions or adding small amounts of meat to foods like pasta or stew. Use beans or vegetables instead of meat in common dishes like chili or lasagna. Try out different cooking methods. Try roasting, broiling, steaming, and sauting vegetables. Add frozen vegetables to soups, stews,  pasta, or rice. Add nuts or seeds for added healthy fats and plant protein at each meal. You can add these to yogurt, salads, or vegetable dishes. Marinate fish or vegetables using olive oil, lemon juice, garlic, and fresh herbs. Meal planning Plan to eat a vegetarian meal one day each week. Try to work up to two vegetarian meals, if possible. Eat seafood two or more times a week. Have healthy snacks on hand. These may include: Vegetable sticks with hummus. Greek yogurt. Fruit and nut trail mix. Eat balanced meals. These should include: Fruit: 2-3 servings a day. Vegetables: 4-5 servings a day. Low-fat dairy: 2 servings a day. Fish, poultry, or lean meat: 1 serving a day. Beans and legumes: 2 or more servings a week. Nuts and seeds: 1-2 servings a day. Whole grains: 6-8 servings a day. Extra-virgin olive oil: 3-4 servings a day. Limit red meat and sweets to just a few servings a month. Lifestyle  Try to cook and eat meals with your family. Drink enough fluid to keep your pee (urine) pale yellow. Be active every day. This includes: Aerobic exercise, which is exercise that causes your heart to beat faster. Examples include running and swimming. Leisure activities like gardening, walking, or housework. Get 7-8 hours of sleep each night. Drink red wine if your provider says you can. A glass of wine is 5 oz (150 mL). You may be allowed to have: Up to 1 glass a day if you're male and not pregnant. Up to 2 glasses a day if you're male. What foods should I eat? Fruits Apples. Apricots. Avocado.  Berries. Bananas. Cherries. Dates. Figs. Grapes. Lemons. Melon. Oranges. Peaches. Plums. Pomegranate. Vegetables Artichokes. Beets. Broccoli. Cabbage. Carrots. Eggplant. Green beans. Chard. Kale. Spinach. Onions. Leeks. Peas. Squash. Tomatoes. Peppers. Radishes. Grains Whole-grain pasta. Brown rice. Bulgur wheat. Polenta. Couscous. Whole-wheat bread. Orpah Cobb. Meats and other  proteins Beans. Almonds. Sunflower seeds. Pine nuts. Peanuts. Cod. Salmon. Scallops. Shrimp. Tuna. Tilapia. Clams. Oysters. Eggs. Chicken or [place] without skin. Dairy Low-fat milk. Cheese. Greek yogurt. Fats and oils Extra-virgin olive oil. Avocado oil. Grapeseed oil. Beverages Water. Red wine. Herbal tea. Sweets and desserts Greek yogurt with honey. Baked apples. Poached pears. Trail mix. Seasonings and condiments Basil. Cilantro. Coriander. Cumin. Mint. Parsley. Sage. Rosemary. Tarragon. Garlic. Oregano. Thyme. Pepper. Balsamic vinegar. Tahini. Hummus. Tomato sauce. Olives. Mushrooms. The items listed above may not be all the foods and drinks you can have. Talk to a dietitian to learn more. What foods should I limit? This is a list of foods that should be eaten rarely. Fruits Fruit canned in syrup. Vegetables Deep-fried potatoes, like [place] fries. Grains Packaged pasta or rice dishes. Cereal with added sugar. Snacks with added sugar. Meats and other proteins Beef. Pork. Lamb. Chicken or [place] with skin. Hot dogs. Tomasa Blase. Dairy Ice cream. Sour cream. Whole milk. Fats and oils Butter. Canola oil. Vegetable oil. Beef fat (tallow). Lard. Beverages Juice. Sugar-sweetened soft drinks. Beer. Liquor and spirits. Sweets and desserts Cookies. Cakes. Pies. Candy. Seasonings and condiments Mayonnaise. Pre-made sauces and marinades. The items listed above may not be all the foods and drinks you should limit. Talk to a dietitian to learn more. Where to find more information American Heart Association (AHA): heart.org This information is not intended to replace advice given to you by your health care provider. Make sure you discuss any questions you have with your health care provider. Document Revised: 09/11/2022 Document Reviewed: 09/11/2022 Elsevier Patient Education  2024 [employer].

## 2023-07-04 ENCOUNTER — Ambulatory Visit (INDEPENDENT_AMBULATORY_CARE_PROVIDER_SITE_OTHER): Payer: PPO | Admitting: Family Medicine

## 2023-07-04 ENCOUNTER — Ambulatory Visit (INDEPENDENT_AMBULATORY_CARE_PROVIDER_SITE_OTHER): Payer: PPO

## 2023-07-04 ENCOUNTER — Other Ambulatory Visit: Payer: Self-pay

## 2023-07-04 VITALS — BP 146/86 | HR 58 | Ht 67.5 in | Wt 221.0 lb

## 2023-07-04 DIAGNOSIS — M79602 Pain in left arm: Secondary | ICD-10-CM | POA: Diagnosis not present

## 2023-07-04 DIAGNOSIS — M4312 Spondylolisthesis, cervical region: Secondary | ICD-10-CM | POA: Diagnosis not present

## 2023-07-04 DIAGNOSIS — M25512 Pain in left shoulder: Secondary | ICD-10-CM | POA: Diagnosis not present

## 2023-07-04 DIAGNOSIS — M25522 Pain in left elbow: Secondary | ICD-10-CM | POA: Diagnosis not present

## 2023-07-04 DIAGNOSIS — M542 Cervicalgia: Secondary | ICD-10-CM | POA: Diagnosis not present

## 2023-07-04 DIAGNOSIS — I6523 Occlusion and stenosis of bilateral carotid arteries: Secondary | ICD-10-CM | POA: Diagnosis not present

## 2023-07-04 DIAGNOSIS — M47812 Spondylosis without myelopathy or radiculopathy, cervical region: Secondary | ICD-10-CM | POA: Diagnosis not present

## 2023-07-04 MED ORDER — GABAPENTIN 300 MG PO CAPS
300.0000 mg | ORAL_CAPSULE | Freq: Three times a day (TID) | ORAL | 3 refills | Status: DC | PRN
Start: 2023-07-04 — End: 2023-08-29

## 2023-07-04 MED ORDER — PREDNISONE 5 MG (48) PO TBPK: ORAL_TABLET | ORAL | 0 refills | Status: DC

## 2023-07-04 NOTE — Patient Instructions (Addendum)
Thank you for coming in today.   Please get an Xray today before you leave   I've referred you to Physical Therapy.  Let us know if you don't hear from them in one week.   I've sent a prescription for Prednisone & Gabapentin to your pharmacy.   Check back in 6 weeks

## 2023-07-04 NOTE — Progress Notes (Signed)
Rubin Payor, PhD, LAT, ATC acting as a scribe for Clementeen Graham, MD.  Johnathan Flynn is a 73 y.o. male who presents to [facility] at [facility] today for L arm pain ongoing since the 1st wk of December. He first noticed the pain after sleeping all night on his L arm and he relates the pain to overdoing it blowing leaves. He is RHD. Pt locates pain to the lateral aspect of the L elbow and into the forearm. Sometime pain w/ radiate up to his L shoulder.  Radiates: no Paresthesia: no Grip strength: no Aggravates: grabbing things Treatments tried: Celebrex, tizanidine, IBU, Tylenol, heating pad, prednisone, massage gun  Pertinent review of systems: No fevers or chills  Relevant historical information: Hypertension. Lives in [place].  Will be moving to [place] in about 6 weeks.  Exam:  BP (!) 146/86   Pulse (!) 58   Ht 5' 7.5" (1.715 m)   Wt 221 lb (100.2 kg)   SpO2 98%   BMI 34.10 kg/m  General: Well Developed, well nourished, and in no acute distress.   MSK: C-spine: Normal appearing Nontender to palpation cervical midline.  Tender palpation left trapezius.  Normal cervical motion.  Mildly positive left-sided Spurling's test.  Left shoulder normal-appearing Normal motion.  No pain with overhead motion. Mildly tender palpation left trapezius.  Left elbow normal appearing. Nontender to palpation.  Normal elbow motion and strength. Unable to reproduce pain with resisted wrist extension flexion or grip.    Lab and Radiology Results  X-ray images cervical spine and left elbow obtained today personally and independently interpreted.  Cervical spine: Mild multilevel DDD.  No acute fractures are visible.  Left elbow: No acute fractures.  Loose body present anterior elbow that to be related to degeneration.  No severe arthritis is visible.  Await formal radiology review     Assessment and Plan: 73 y.o. male with chronic left arm pain worsening  after increasing activity with doing a lot of leaf blowing.  Pain is diffuse located in multiple areas in the left arm including trapezius shoulder upper arm elbow and the lateral forearm.  I am unable to reproduce pain as expected for lateral epicondylitis.  Additionally he is not tender in this area.  This makes lateral epicondylitis quite unlikely.  Differential diagnosis includes cervical radiculopathy is the most likely explanation.  We discussed options.  Plan for trial of physical therapy.  Additionally okay to continue Celebrex.  I did prescribe prednisone and gabapentin to use if worsening.  Recheck in about 6 weeks.  If needed consider steroid injection on recheck especially if his pain is more located in the lateral elbow.  If cervical radiculopathy continues to be the most likely diagnosis cervical spine MRI would typically be next step if not better.   PDMP not reviewed this encounter. Orders Placed This Encounter  Procedures   [place] LIMITED JOINT SPACE STRUCTURES UP LEFT(NO LINKED CHARGES)    Reason for Exam (SYMPTOM  OR DIAGNOSIS REQUIRED):   left elbow pain    Preferred imaging location?:   [facility] Cervical Spine 2 or 3 views    Standing Status:   Future    Number of Occurrences:   1    Expiration Date:   07/03/2024    Reason for Exam (SYMPTOM  OR DIAGNOSIS REQUIRED):   cervical radiculopathy    Preferred imaging location?:   [facility]   DG ELBOW COMPLETE LEFT (3+VIEW)  Standing Status:   Future    Number of Occurrences:   1    Expiration Date:   08/04/2023    Reason for Exam (SYMPTOM  OR DIAGNOSIS REQUIRED):   left elbow pain    Preferred imaging location?:   [facility]   Ambulatory referral to Physical Therapy    Referral Priority:   Routine    Referral Type:   Physical Medicine    Referral Reason:   Specialty Services Required    Requested Specialty:   Physical Therapy    Number of Visits Requested:   1   Meds  ordered this encounter  Medications   predniSONE (STERAPRED UNI-PAK 48 TAB) 5 MG (48) TBPK tablet    Sig: 12 day dosepack po    Dispense:  48 tablet    Refill:  0   gabapentin (NEURONTIN) 300 MG capsule    Sig: Take 1 capsule (300 mg total) by mouth 3 (three) times daily as needed (nerve pain. Will make you sleepy).    Dispense:  90 capsule    Refill:  3     Discussed warning signs or symptoms. Please see discharge instructions. Patient expresses understanding.   The above documentation has been reviewed and is accurate and complete Clementeen Graham, M.D.

## 2023-07-07 ENCOUNTER — Encounter: Payer: Self-pay | Admitting: Family Medicine

## 2023-07-07 NOTE — Progress Notes (Signed)
Left elbow x-ray looks normal to radiology.

## 2023-07-07 NOTE — Progress Notes (Signed)
X-ray images cervical spine shows some arthritis changes.

## 2023-07-12 ENCOUNTER — Telehealth: Payer: Self-pay | Admitting: Family Medicine

## 2023-07-12 NOTE — Telephone Encounter (Signed)
Pt called, took gabapentin for a couple days but is feeling better and has stopped. Concerned as instructions say "do not stop taking cold [place]".  Pt given reassurance, as he took so few and under recommended dosage.  Also had to cancel PT as he is in the process of moving. Hopes to reschedule PT in the future.  FYI unless concerns.

## 2023-07-12 NOTE — Telephone Encounter (Signed)
Forwarding to Dr. Denyse Amass as Lorain Childes.

## 2023-07-13 NOTE — Telephone Encounter (Signed)
All appropriate advice and recommendations.  Happy to help the patient in the future if needed.

## 2023-08-15 ENCOUNTER — Ambulatory Visit: Payer: BLUE CROSS/BLUE SHIELD | Admitting: Family Medicine

## 2023-08-29 ENCOUNTER — Ambulatory Visit (INDEPENDENT_AMBULATORY_CARE_PROVIDER_SITE_OTHER): Payer: BLUE CROSS/BLUE SHIELD | Admitting: Family Medicine

## 2023-08-29 ENCOUNTER — Encounter: Payer: Self-pay | Admitting: Family Medicine

## 2023-08-29 VITALS — BP 130/80 | HR 64 | Temp 98.7°F | Resp 16 | Ht 67.5 in | Wt 212.2 lb

## 2023-08-29 DIAGNOSIS — N401 Enlarged prostate with lower urinary tract symptoms: Secondary | ICD-10-CM | POA: Diagnosis not present

## 2023-08-29 DIAGNOSIS — Z23 Encounter for immunization: Secondary | ICD-10-CM

## 2023-08-29 DIAGNOSIS — E782 Mixed hyperlipidemia: Secondary | ICD-10-CM

## 2023-08-29 DIAGNOSIS — R5382 Chronic fatigue, unspecified: Secondary | ICD-10-CM

## 2023-08-29 DIAGNOSIS — I10 Essential (primary) hypertension: Secondary | ICD-10-CM

## 2023-08-29 DIAGNOSIS — R350 Frequency of micturition: Secondary | ICD-10-CM

## 2023-08-29 DIAGNOSIS — M79671 Pain in right foot: Secondary | ICD-10-CM | POA: Diagnosis not present

## 2023-08-29 NOTE — Progress Notes (Signed)
 Subjective:  Patient ID: Johnathan Flynn, male    DOB: July 08, 1950  Age: 73 y.o. MRN: [id number]  Chief Complaint  Patient presents with   Medical Management of Chronic Issues    Discussed the use of AI scribe software for clinical note transcription with the patient, who gave verbal consent to proceed.  HPI Johnathan Flynn "Johnathan Flynn" is a 73 year old male with hypertension and benign prostatic hyperplasia who presents with concerns about blood pressure management and prostate symptoms.   He has been managing his blood pressure, which has improved since his recent move, with a current reading of 130/72 mmHg, down from previous readings of 146/86 mmHg and 170/98 mmHg. He attributes this improvement to weight loss, having lost 10 pounds recently, and aims to lose an additional 20 pounds. He monitors his blood pressure at home and notes that stress related to his move had previously elevated his readings. No chest pain or shortness of breath.  He experiences ongoing issues with an enlarged prostate, for which he has been taking an over-the-counter medication called Urinazine, prescribed approximately two years ago. The medication provides some relief but not significantly. Symptoms are more pronounced in the morning, including waking up once or twice at night to urinate, which he attributes in part to drinking fluids late into the evening. He is interested in exploring further treatment options for his prostate symptoms.  He reports pain in his right foot, particularly in the toes, which he attributes to being on his feet extensively during his recent move. The pain has improved since he stopped wearing thick socks, which he believes may have contributed to the discomfort. He mentions a fungal infection on his second toe, which appears red and has been causing some discomfort. He has been attempting to manage this by trimming the affected area but is considering further evaluation.  He has a history of left  arm pain, suspected to be nerve-related, but reports that the pain has resolved over the past six to seven weeks. His left hand occasionally locks up, which he associates with the previous arm pain. He has discontinued all pain medications, including gabapentin and extra-strength Tylenol, and no longer experiences significant discomfort.  He mentions a history of fatigue and frequent napping, which he attributes to his 73.  He enjoys outdoor activities and sunlight exposure, which he believes helps with his energy levels.     01/27/2023   12:02 PM 06/17/2021    3:12 PM  Depression screen PHQ 2/9  Decreased Interest 0 0  Down, Depressed, Hopeless 0 0  PHQ - 2 Score 0 0  Altered sleeping 2   Tired, decreased energy 1   Change in appetite 0   Feeling bad or failure about yourself  0   Trouble concentrating 0   Moving slowly or fidgety/restless 0   Suicidal thoughts 0   PHQ-9 Score 3   Difficult doing work/chores Not difficult at all         01/27/2023   12:02 PM  Fall Risk   Falls in the past year? 0  Number falls in past yr: 0  Injury with Fall? 0  Risk for fall due to : No Fall Risks  Follow up Falls evaluation completed    Patient Care Team: Blane Ohara, MD as PCP - General (Internal Medicine)   Review of Systems  Constitutional:  Positive for fatigue. Negative for appetite change and fever.  HENT:  Negative for congestion, ear pain, sinus pressure and sore throat.  Eyes: Negative.   Respiratory:  Negative for cough, chest tightness, shortness of breath and wheezing.   Cardiovascular:  Negative for chest pain and palpitations.  Gastrointestinal:  Negative for abdominal pain, constipation, diarrhea, nausea and vomiting.  Endocrine: Negative.   Genitourinary:  Negative for dysuria and hematuria.       Nocturia  Musculoskeletal:  Positive for arthralgias (right foot). Negative for back pain, joint swelling and myalgias.  Skin:  Negative for rash.  Allergic/Immunologic:  Negative.   Neurological:  Negative for dizziness, weakness and headaches.  Hematological: Negative.   Psychiatric/Behavioral:  Negative for dysphoric mood. The patient is not nervous/anxious.     Current Outpatient Medications on File Prior to Visit  Medication Sig Dispense Refill   aspirin EC 81 MG tablet Take 81 mg by mouth daily. Swallow whole.     losartan (COZAAR) 50 MG tablet Take 1 tablet (50 mg total) by mouth daily. 90 tablet 0   MISC NATURAL PRODUCTS PO Take by mouth. URINOZINC PROSTATE CLASSIC PO 1 tablet once daily.     Multiple Vitamin (MULTIVITAMIN) tablet Take 1 tablet by mouth daily.     omega-3 fish oil (MAXEPA) 1000 MG CAPS capsule Take by mouth.     No current facility-administered medications on file prior to visit.   Past Medical History:  Diagnosis Date   Hyperlipidemia    Hypertension    Need for hepatitis C screening test 04/02/2022   Prediabetes    History reviewed. No pertinent surgical history.  Family History  Problem Relation Age of Onset   Alcoholism Paternal Grandfather    Social History   Socioeconomic History   Marital status: Widowed    Spouse name: Not on file   Number of children: Not on file   Years of education: Not on file   Highest education level: 12th grade  Occupational History    Comment: Retired. Used to be Merchandiser, retail in [employer].  Tobacco Use   Smoking status: Every Day    Types: Cigars   Smokeless tobacco: Not on file  Substance and Sexual Activity   Alcohol use: Not on file   Drug use: Never   Sexual activity: Yes    Comment: one partner  Other Topics Concern   Not on file  Social History Narrative   Widowed. Girlfriend.    Children:    Coffee 2-4 cups per day.    Social Drivers of Corporate investment banker Strain: Low Risk  (06/28/2023)   Overall Financial Resource Strain (CARDIA)    Difficulty of Paying Living Expenses: Not hard at all  Food Insecurity: No Food Insecurity (06/28/2023)   Hunger Vital Sign     Worried About Running Out of Food in the Last Year: Never true    Ran Out of Food in the Last Year: Never true  Transportation Needs: No Transportation Needs (06/28/2023)   PRAPARE - Administrator, Civil Service (Medical): No    Lack of Transportation (Non-Medical): No  Physical Activity: Unknown (06/28/2023)   Exercise Vital Sign    Days of Exercise per Week: 2 days    Minutes of Exercise per Session: Patient declined  Stress: No Stress Concern Present (06/28/2023)   [employer] of Occupational Health - Occupational Stress Questionnaire    Feeling of Stress : Not at all  Social Connections: Unknown (06/28/2023)   Social Connection and Isolation Panel [NHANES]    Frequency of Communication with Friends and Family: More than three times a week  Frequency of Social Gatherings with Friends and Family: Twice a week    Attends Religious Services: Patient declined    Active Member of Clubs or Organizations: No    Attends Banker Meetings: Never    Marital Status: Widowed    Objective:  BP 130/80   Pulse 64   Temp 98.7 F (37.1 C) (Temporal)   Resp 16   Ht 5' 7.5" (1.715 m)   Wt 212 lb 3.2 oz (96.3 kg)   SpO2 97%   BMI 32.74 kg/m      08/29/2023    2:33 PM 08/29/2023    1:42 PM 07/04/2023    1:57 PM  BP/Weight  Systolic BP 130 130 146  Diastolic BP 80 72 86  Wt. (Lbs)  212.2 221  BMI  32.74 kg/m2 34.1 kg/m2    Physical Exam Vitals reviewed.  Constitutional:      General: He is not in acute distress.    Appearance: Normal appearance. He is not ill-appearing.  Eyes:     Conjunctiva/sclera: Conjunctivae normal.  Cardiovascular:     Rate and Rhythm: Normal rate and regular rhythm.     Heart sounds: Normal heart sounds. No murmur heard. Pulmonary:     Effort: Pulmonary effort is normal.     Breath sounds: Normal breath sounds. No wheezing or rhonchi.  Abdominal:     Palpations: Abdomen is soft.  Musculoskeletal:        General: Normal range  of motion.     Cervical back: Normal range of motion.  Skin:    General: Skin is warm.  Neurological:     Mental Status: He is alert. Mental status is at baseline.  Psychiatric:        Mood and Affect: Mood normal.        Behavior: Behavior normal.    Lab Results  Component Value Date   WBC 3.6 01/27/2023   HGB 14.1 01/27/2023   HCT 42.7 01/27/2023   PLT 151 01/27/2023   GLUCOSE 106 (H) 01/27/2023   CHOL 180 01/27/2023   TRIG 61 01/27/2023   HDL 57 01/27/2023   LDLCALC 111 (H) 01/27/2023   ALT 13 01/27/2023   AST 17 01/27/2023   NA 140 01/27/2023   K 4.5 01/27/2023   CL 103 01/27/2023   CREATININE 0.92 01/27/2023   BUN 24 01/27/2023   CO2 25 01/27/2023   TSH 1.210 01/27/2023   HGBA1C 5.6 01/27/2023    Assessment & Plan:   Essential hypertension, benign Assessment & Plan: Chronic - improved Patient recently had to move out of his house and his last 2 visits he was extremely stressed. Patient has lost 10 pounds and is more active and eating healthier.  BP Readings from Last 3 Encounters:  08/29/23 130/80  07/04/23 (!) 146/86  06/29/23 (!) 178/98      Orders: -     CBC with Differential/Platelet -     Comprehensive metabolic panel  Mixed hyperlipidemia Assessment & Plan: Patient currently taking fish oil and recently starting eating a healthier diet and exercising. Lab Results  Component Value Date   LDLCALC 111 (H) 01/27/2023  - labs drawn, Await labs/testing for assessment and recommendations   Orders: -     Lipid panel  Benign prostatic hyperplasia with urinary frequency Assessment & Plan: Ongoing urinary symptoms persist despite OTC treatment. Current management partially effective. Referral to urologist needed for advanced options. - Refer to a urologist for further evaluation and management.  Orders: -     Ambulatory referral to Urology  Right foot pain Assessment & Plan: Intermittent right foot pain, possibly due to ill-fitting shoes and  fungal infection. Referral to podiatrist needed. - Refer to a podiatrist for evaluation of foot pain and possible fungal infection.  Orders: -     Ambulatory referral to Podiatry  Chronic fatigue Assessment & Plan: Labs drawn, Await labs/testing for assessment and recommendations   Orders: -     VITAMIN D 25 Hydroxy (Vit-D Deficiency, Fractures)  Immunization due -     Flu Vaccine Trivalent High Dose (Fluad)     No orders of the defined types were placed in this encounter.   Orders Placed This Encounter  Procedures   Flu Vaccine Trivalent High Dose (Fluad)   CBC with Differential   Comprehensive metabolic panel   Lipid Panel   Vitamin D, 25-hydroxy   Ambulatory referral to Podiatry   Ambulatory referral to Urology     Follow-up: Return in about 3 months (around 11/29/2023) for chronic.  An After Visit Summary was printed and given to the patient.  Total time spent on today's visit was 50 minutes, including both face-to-face time and nonface-to-face time personally spent on review of chart (labs and imaging), discussing labs and goals, discussing further work-up, treatment options, referrals to specialist if needed, reviewing outside records if pertinent, answering patient's questions, and coordinating care.    Lajuana Matte, FNP [facility] [phone]

## 2023-08-29 NOTE — Assessment & Plan Note (Addendum)
 Labs drawn, Await labs/testing for assessment and recommendations

## 2023-08-29 NOTE — Assessment & Plan Note (Signed)
 Patient currently taking fish oil and recently starting eating a healthier diet and exercising. Lab Results  Component Value Date   LDLCALC 111 (H) 01/27/2023  - labs drawn, Await labs/testing for assessment and recommendations

## 2023-08-29 NOTE — Assessment & Plan Note (Signed)
 Chronic - improved Patient recently had to move out of his house and his last 2 visits he was extremely stressed. Patient has lost 10 pounds and is more active and eating healthier.  BP Readings from Last 3 Encounters:  08/29/23 130/80  07/04/23 (!) 146/86  06/29/23 (!) 178/98

## 2023-08-29 NOTE — Assessment & Plan Note (Signed)
 Intermittent right foot pain, possibly due to ill-fitting shoes and fungal infection. Referral to podiatrist needed. - Refer to a podiatrist for evaluation of foot pain and possible fungal infection.

## 2023-08-29 NOTE — Assessment & Plan Note (Signed)
 Ongoing urinary symptoms persist despite OTC treatment. Current management partially effective. Referral to urologist needed for advanced options. - Refer to a urologist for further evaluation and management.

## 2023-08-30 ENCOUNTER — Other Ambulatory Visit: Payer: Self-pay | Admitting: Family Medicine

## 2023-08-30 ENCOUNTER — Encounter: Payer: Self-pay | Admitting: Family Medicine

## 2023-08-30 ENCOUNTER — Ambulatory Visit (INDEPENDENT_AMBULATORY_CARE_PROVIDER_SITE_OTHER): Admitting: Family Medicine

## 2023-08-30 VITALS — BP 144/80 | HR 69 | Ht 67.5 in

## 2023-08-30 DIAGNOSIS — M5412 Radiculopathy, cervical region: Secondary | ICD-10-CM

## 2023-08-30 DIAGNOSIS — E559 Vitamin D deficiency, unspecified: Secondary | ICD-10-CM

## 2023-08-30 LAB — COMPREHENSIVE METABOLIC PANEL
ALT: 17 IU/L (ref 0–44)
AST: 19 IU/L (ref 0–40)
Albumin: 4.5 g/dL (ref 3.9–4.9)
Alkaline Phosphatase: 65 IU/L (ref 44–121)
BUN/Creatinine Ratio: 21 (ref 10–24)
BUN: 16 mg/dL (ref 8–27)
Bilirubin Total: 0.4 mg/dL (ref 0.0–1.2)
CO2: 21 mmol/L (ref 20–29)
Calcium: 10.1 mg/dL (ref 8.6–10.2)
Chloride: 106 mmol/L (ref 96–106)
Creatinine, Ser: 0.78 mg/dL (ref 0.76–1.27)
Globulin, Total: 2 g/dL (ref 1.5–4.5)
Glucose: 92 mg/dL (ref 70–99)
Potassium: 4.3 mmol/L (ref 3.5–5.2)
Sodium: 139 mmol/L (ref 134–144)
Total Protein: 6.5 g/dL (ref 6.0–8.5)
eGFR: 96 mL/min/{1.73_m2} (ref >59)

## 2023-08-30 LAB — LIPID PANEL
Chol/HDL Ratio: 3.6 ratio (ref 0.0–5.0)
Cholesterol, Total: 189 mg/dL (ref 100–199)
HDL: 52 mg/dL (ref >39)
LDL Chol Calc (NIH): 124 mg/dL — ABNORMAL HIGH (ref 0–99)
Triglycerides: 70 mg/dL (ref 0–149)
VLDL Cholesterol Cal: 13 mg/dL (ref 5–40)

## 2023-08-30 LAB — CBC WITH DIFFERENTIAL/PLATELET
Basophils Absolute: 0 10*3/uL (ref 0.0–0.2)
Basos: 1 %
EOS (ABSOLUTE): 0.1 10*3/uL (ref 0.0–0.4)
Eos: 1 %
Hematocrit: 43.7 % (ref 37.5–51.0)
Hemoglobin: 14.3 g/dL (ref 13.0–17.7)
Immature Grans (Abs): 0 10*3/uL (ref 0.0–0.1)
Immature Granulocytes: 0 %
Lymphocytes Absolute: 1.2 10*3/uL (ref 0.7–3.1)
Lymphs: 27 %
MCH: 30 pg (ref 26.6–33.0)
MCHC: 32.7 g/dL (ref 31.5–35.7)
MCV: 92 fL (ref 79–97)
Monocytes Absolute: 0.4 10*3/uL (ref 0.1–0.9)
Monocytes: 9 %
Neutrophils Absolute: 2.7 10*3/uL (ref 1.4–7.0)
Neutrophils: 62 %
Platelets: 149 10*3/uL — ABNORMAL LOW (ref 150–450)
RBC: 4.77 x10E6/uL (ref 4.14–5.80)
RDW: 13.6 % (ref 11.6–15.4)
WBC: 4.4 10*3/uL (ref 3.4–10.8)

## 2023-08-30 LAB — VITAMIN D 25 HYDROXY (VIT D DEFICIENCY, FRACTURES): Vit D, 25-Hydroxy: 29.5 ng/mL — ABNORMAL LOW (ref 30.0–100.0)

## 2023-08-30 MED ORDER — VITAMIN D (ERGOCALCIFEROL) 1.25 MG (50000 UNIT) PO CAPS: 50000.0000 [IU] | ORAL_CAPSULE | ORAL | 0 refills | Status: DC

## 2023-08-30 NOTE — Progress Notes (Signed)
   I, Stevenson Clinch, CMA acting as a scribe for Clementeen Graham, MD.  Johnathan Flynn is a 73 y.o. male who presents to [facility] at [facility] today for f/u L arm pain. Pt was last seen by Dr. Denyse Amass on 07/04/23 and was advised to cont Celebrex, was prescribed gabapentin and prednisone. He was also referred to [facility].  Today, pt reports significant improvement of left arm symptoms since last visit. Pt did not attend PT d/t upcoming move. Was able to pack and move without worsening pain, has almost pain free for 7-8 weeks. Will get occasional dull ache. Notes a few occurences where the hand locked up but he was able to work it back out.  Has not taken Gabapentin in 6 weeks. Completed oral steroid.   Dx imaging: 07/04/23 L elbow & c-spine XR  Pertinent review of systems: No fevers or chills  Relevant historical information: Hypertension   Exam:  BP (!) 144/80   Pulse 69   Ht 5' 7.5" (1.715 m)   SpO2 99%   BMI 32.74 kg/m  General: Well Developed, well nourished, and in no acute distress.   MSK: C-spine normal appearing normal cervical motion upper extremity strength is intact.    No results found.     Assessment and Plan: 73 y.o. male with left arm pain due to cervical radiculopathy.  There may be some coexisting tendinitis as well but the dominant problem is cervical radiculopathy.  Fortunately he is feeling better.  Can prescribe again in the future if needed prednisone and provide more gabapentin if needed.  Could consider a trial of physical therapy as well as he never was able to get to PT after his last visit.  For now watchful waiting check back as needed.   PDMP not reviewed this encounter. No orders of the defined types were placed in this encounter.  No orders of the defined types were placed in this encounter.    Discussed warning signs or symptoms. Please see discharge instructions. Patient expresses understanding.   The above documentation  has been reviewed and is accurate and complete Clementeen Graham, M.D.

## 2023-09-05 ENCOUNTER — Ambulatory Visit (INDEPENDENT_AMBULATORY_CARE_PROVIDER_SITE_OTHER): Admitting: Podiatry

## 2023-09-05 DIAGNOSIS — M79674 Pain in right toe(s): Secondary | ICD-10-CM | POA: Diagnosis not present

## 2023-09-05 DIAGNOSIS — L603 Nail dystrophy: Secondary | ICD-10-CM

## 2023-09-05 DIAGNOSIS — M79675 Pain in left toe(s): Secondary | ICD-10-CM | POA: Diagnosis not present

## 2023-09-05 DIAGNOSIS — B351 Tinea unguium: Secondary | ICD-10-CM | POA: Diagnosis not present

## 2023-09-05 NOTE — Progress Notes (Unsigned)
  Subjective:  Patient ID: Johnathan Flynn, male    DOB: 02-10-1951,  MRN: [id number]  Chief Complaint  Patient presents with   Nail Problem    Right 2 nail is long and curved under itself. Would like to establish RFC going forward. He does think he has a fungus on the 2nd nail as well. He does try to trim his nails, but the 2nd toes is really thick. Not diabetic, and takes ASA 65    73 y.o. male presents with the above complaint. History confirmed with patient. Patient presenting with pain related to dystrophic thickened elongated nails. Patient is unable to trim own nails related to nail dystrophy and/or mobility issues. Patient does not have a history of T2DM.  Right second toenail is especially painful and starting to curve into the toe distally Objective:  Physical Exam: warm, good capillary refill nail exam onychomycosis of the toenails, onycholysis, dystrophic nails, and clubbing DP pulses palpable, PT pulses palpable, and protective sensation intact Left Foot:  Pain with palpation of nails due to elongation and dystrophic growth.  Right Foot: Pain with palpation of nails due to elongation and dystrophic growth.  Second toenail distal aspect and medial aspect painful on palpation with some incurvation  Assessment:   1. Pain due to onychomycosis of toenails of both feet   2. Onychodystrophy      Plan:  Patient was evaluated and treated and all questions answered.  #Onychomycosis with pain  -Nails palliatively debrided as below. -Educated on self-care -Second toe pain improved with debridement of the nail -Patient wanting to discuss treatment options for onychomycosis.  We did discuss oral versus topical treatments along with removal of painful toenails.  Likely plan to proceed with oral medication if cultures are positive for onychomycosis.  He can continue over-the-counter topical treatments for now.  Sending nail specimen today for fungal pathology. -Did recently have lab work on  08/29/2023 showing no elevation of liver enzymes, no concerning findings on his CBC pertaining to his current presentation.  Procedure: Nail Debridement Rationale: Pain Type of Debridement: manual, sharp debridement. Instrumentation: Nail nipper, rotary burr. Number of Nails: 10  Return in about 3 weeks (around 09/26/2023) for Nail Pathology.         Bronwen Betters, DPM [facility] / [facility]

## 2023-09-06 ENCOUNTER — Encounter: Payer: Self-pay | Admitting: Podiatry

## 2023-09-12 ENCOUNTER — Other Ambulatory Visit: Payer: Self-pay | Admitting: Podiatry

## 2023-09-12 DIAGNOSIS — N401 Enlarged prostate with lower urinary tract symptoms: Secondary | ICD-10-CM | POA: Diagnosis not present

## 2023-09-12 DIAGNOSIS — Z79899 Other long term (current) drug therapy: Secondary | ICD-10-CM | POA: Diagnosis not present

## 2023-09-12 DIAGNOSIS — N529 Male erectile dysfunction, unspecified: Secondary | ICD-10-CM | POA: Diagnosis not present

## 2023-09-12 DIAGNOSIS — R351 Nocturia: Secondary | ICD-10-CM | POA: Diagnosis not present

## 2023-09-26 ENCOUNTER — Ambulatory Visit (INDEPENDENT_AMBULATORY_CARE_PROVIDER_SITE_OTHER): Admitting: Podiatry

## 2023-09-26 ENCOUNTER — Encounter: Payer: Self-pay | Admitting: Podiatry

## 2023-09-26 DIAGNOSIS — M79674 Pain in right toe(s): Secondary | ICD-10-CM

## 2023-09-26 DIAGNOSIS — M79675 Pain in left toe(s): Secondary | ICD-10-CM

## 2023-09-26 DIAGNOSIS — L603 Nail dystrophy: Secondary | ICD-10-CM | POA: Diagnosis not present

## 2023-09-26 DIAGNOSIS — B351 Tinea unguium: Secondary | ICD-10-CM

## 2023-09-26 MED ORDER — TERBINAFINE HCL 250 MG PO TABS: 250.0000 mg | ORAL_TABLET | Freq: Every day | ORAL | 0 refills | Status: DC

## 2023-09-26 NOTE — Progress Notes (Signed)
  Subjective:  Patient ID: Johnathan Flynn, male    DOB: 15-Sep-1950,  MRN: [id number]  Chief Complaint  Patient presents with   Results    Here today for Pathology results. States no pain in the toes today.     73 y.o. male presents with the above complaint. History confirmed with patient.  He is here to discuss nail pathology results.  Patient is unable to trim own nails related to nail dystrophy.  The right second toenail gets especially painful as it grows out due to its dystrophic nature.  Patient does not have a history of T2DM.   Objective:  Physical Exam: warm, good capillary refill nail exam onychomycosis of the toenails, onycholysis, dystrophic nails, and clubbing DP pulses palpable, PT pulses palpable, and protective sensation intact Left Foot: Nail plates within normal limits for thickness and length today Right Foot: Nail plates within normal limits for thickness and length today.  Assessment:   1. Pain due to onychomycosis of toenails of both feet   2. Onychodystrophy      Plan:  Patient was evaluated and treated and all questions answered.  #Onychomycosis with pain  - Reviewed nail pathology with patient.  Positive for onychomycosis Patient interested in oral medication.  We will proceed with this.  Starting 90-day course of oral Lamisil  250 mg daily -Did recently have lab work on 08/29/2023 showing no elevation of liver enzymes, no concerning findings on his CBC pertaining to his current presentation. - Right 1st and 2nd toenails are the most severely affected.  Did discuss that there may still be residual nail deformity following successful treatment did discuss that the patient may want to consider removal of the second toenail but continues to be thickened and painful.  He can follow-up as needed if he would like removal of the affected toenail.   Return in about 3 months (around 12/26/2023) for Onychomycosis.         Eve Hinders, DPM [facility] / [facility]

## 2023-10-25 DIAGNOSIS — H2513 Age-related nuclear cataract, bilateral: Secondary | ICD-10-CM | POA: Diagnosis not present

## 2023-10-27 ENCOUNTER — Other Ambulatory Visit: Payer: Self-pay | Admitting: Family Medicine

## 2023-10-27 DIAGNOSIS — I10 Essential (primary) hypertension: Secondary | ICD-10-CM

## 2023-10-30 ENCOUNTER — Other Ambulatory Visit: Payer: Self-pay | Admitting: Family Medicine

## 2023-10-30 ENCOUNTER — Telehealth: Payer: Self-pay

## 2023-10-30 DIAGNOSIS — Z1211 Encounter for screening for malignant neoplasm of colon: Secondary | ICD-10-CM

## 2023-10-30 NOTE — Telephone Encounter (Signed)
 Pt requesting call back to schedule colonoscopy.

## 2023-10-30 NOTE — Telephone Encounter (Signed)
 There is no referral for patient regarding scheduling colonoscopy. However, patient did a message on MyChart to PCP wanting closer location as patient lives in [place].

## 2023-10-31 NOTE — Telephone Encounter (Signed)
 Spoken to patient and inform him that his PCP just send a new referral and they requesting [place] location for patient. Patient is going to wait for a week and hope that he will be contact regarding the referral. If not he will call over here.

## 2023-11-13 ENCOUNTER — Other Ambulatory Visit: Payer: Self-pay | Admitting: Family Medicine

## 2023-11-13 DIAGNOSIS — E559 Vitamin D deficiency, unspecified: Secondary | ICD-10-CM

## 2023-12-26 ENCOUNTER — Ambulatory Visit: Admitting: Podiatry

## 2023-12-27 DIAGNOSIS — N529 Male erectile dysfunction, unspecified: Secondary | ICD-10-CM | POA: Diagnosis not present

## 2023-12-27 DIAGNOSIS — N401 Enlarged prostate with lower urinary tract symptoms: Secondary | ICD-10-CM | POA: Diagnosis not present

## 2023-12-27 DIAGNOSIS — R339 Retention of urine, unspecified: Secondary | ICD-10-CM | POA: Diagnosis not present

## 2023-12-27 DIAGNOSIS — R351 Nocturia: Secondary | ICD-10-CM | POA: Diagnosis not present

## 2024-01-03 ENCOUNTER — Other Ambulatory Visit: Payer: Self-pay | Admitting: Podiatry

## 2024-01-03 DIAGNOSIS — M79674 Pain in right toe(s): Secondary | ICD-10-CM

## 2024-01-16 ENCOUNTER — Encounter: Payer: Self-pay | Admitting: Podiatry

## 2024-01-16 ENCOUNTER — Ambulatory Visit (INDEPENDENT_AMBULATORY_CARE_PROVIDER_SITE_OTHER): Admitting: Podiatry

## 2024-01-16 DIAGNOSIS — Z79899 Other long term (current) drug therapy: Secondary | ICD-10-CM

## 2024-01-16 DIAGNOSIS — B351 Tinea unguium: Secondary | ICD-10-CM | POA: Diagnosis not present

## 2024-01-16 DIAGNOSIS — M79675 Pain in left toe(s): Secondary | ICD-10-CM | POA: Diagnosis not present

## 2024-01-16 DIAGNOSIS — M79674 Pain in right toe(s): Secondary | ICD-10-CM | POA: Diagnosis not present

## 2024-01-16 NOTE — Progress Notes (Unsigned)
  Subjective:  Patient ID: Johnathan Flynn, male    DOB: [birth year],  MRN: [id number]  Chief Complaint  Patient presents with   [facility]    [facility] with callous. Last A1c 5.6 takes, ASA.    73 y.o. male presents with the above complaint. History confirmed with patient. Patient presenting with pain related to dystrophic thickened elongated nails. Patient is unable to trim own nails related to nail dystrophy and/or mobility issues. Patient does not have a history of T2DM.  He has completed 90-day course of oral terbinafine .  Nails do appear dystrophic but he does believe that they would have shown improvement in overall thickness skin discoloration. Objective:  Physical Exam: warm, good capillary refill nail exam dystrophy of the toenails with clubbing of the lesser digits, there is some yellow discoloration of the digits x 10 the most affected toenails are the bilateral first toenails, right second toenail with some longitudinal streaking affecting the distal third of the nail plates DP pulses palpable, PT pulses palpable, and protective sensation intact Left Foot:  Pain with palpation of nails due to elongation and dystrophic growth.  Right Foot: Pain with palpation of nails due to elongation and dystrophic growth.    Assessment:   1. Pain due to onychomycosis of toenails of both feet      Plan:  Patient was evaluated and treated and all questions answered.  #Onychomycosis with pain  -Nails palliatively debrided as below. -Educated on self-care  # Encounter for current use of long-term high-risk medication - Overall has had good improvement following 90-day course of oral Lamisil . - There are some residual changes mostly to the right 1st and 2nd toenails, left first toenail predominantly as described above - Obtaining updated liver function panel and CBC.  Extending course of oral Lamisil .  Will follow-up in about 2 months to check for progress. - Did discuss that some of the nail changes could  be due to microtrauma over time and that he may be a level of dystrophy that remains as well following full resolution of onychomycosis -I certify that this diagnosis represents a distinct and separate diagnosis that requires evaluation and treatment separate from other procedures or diagnosis   Procedure: Nail Debridement Rationale: Pain Type of Debridement: manual, sharp debridement. Instrumentation: Nail nipper, rotary burr. Number of Nails: 10  Return in about 2 months (around 03/17/2024) for Onychomycosis/med check.         Ethan Saddler, DPM [facility] / [facility]

## 2024-01-17 LAB — HEPATIC FUNCTION PANEL
ALT: 20 IU/L (ref 0–44)
AST: 18 IU/L (ref 0–40)
Albumin: 4.4 g/dL (ref 3.8–4.8)
Alkaline Phosphatase: 61 IU/L (ref 44–121)
Bilirubin Total: 0.4 mg/dL (ref 0.0–1.2)
Bilirubin, Direct: 0.17 mg/dL (ref 0.00–0.40)
Total Protein: 6.6 g/dL (ref 6.0–8.5)

## 2024-01-17 LAB — CBC
Hematocrit: 40.8 % (ref 37.5–51.0)
Hemoglobin: 13.2 g/dL (ref 13.0–17.7)
MCH: 30.1 pg (ref 26.6–33.0)
MCHC: 32.4 g/dL (ref 31.5–35.7)
MCV: 93 fL (ref 79–97)
Platelets: 135 x10E3/uL — ABNORMAL LOW (ref 150–450)
RBC: 4.38 x10E6/uL (ref 4.14–5.80)
RDW: 13.6 % (ref 11.6–15.4)
WBC: 3.3 x10E3/uL — ABNORMAL LOW (ref 3.4–10.8)

## 2024-01-18 ENCOUNTER — Ambulatory Visit

## 2024-01-26 ENCOUNTER — Other Ambulatory Visit: Payer: Self-pay | Admitting: Family Medicine

## 2024-01-26 DIAGNOSIS — I10 Essential (primary) hypertension: Secondary | ICD-10-CM

## 2024-01-29 DIAGNOSIS — Z01818 Encounter for other preprocedural examination: Secondary | ICD-10-CM | POA: Diagnosis not present

## 2024-01-30 ENCOUNTER — Other Ambulatory Visit: Payer: Self-pay | Admitting: Family Medicine

## 2024-01-30 DIAGNOSIS — E559 Vitamin D deficiency, unspecified: Secondary | ICD-10-CM

## 2024-02-06 DIAGNOSIS — Z1211 Encounter for screening for malignant neoplasm of colon: Secondary | ICD-10-CM | POA: Diagnosis not present

## 2024-02-06 DIAGNOSIS — K644 Residual hemorrhoidal skin tags: Secondary | ICD-10-CM | POA: Diagnosis not present

## 2024-02-06 DIAGNOSIS — Z91040 Latex allergy status: Secondary | ICD-10-CM | POA: Diagnosis not present

## 2024-02-06 DIAGNOSIS — K635 Polyp of colon: Secondary | ICD-10-CM | POA: Diagnosis not present

## 2024-02-06 DIAGNOSIS — K648 Other hemorrhoids: Secondary | ICD-10-CM | POA: Diagnosis not present

## 2024-02-06 DIAGNOSIS — Z860100 Personal history of colon polyps, unspecified: Secondary | ICD-10-CM | POA: Diagnosis not present

## 2024-02-06 DIAGNOSIS — I10 Essential (primary) hypertension: Secondary | ICD-10-CM | POA: Diagnosis not present

## 2024-03-19 ENCOUNTER — Encounter: Payer: Self-pay | Admitting: Podiatry

## 2024-03-19 ENCOUNTER — Ambulatory Visit (INDEPENDENT_AMBULATORY_CARE_PROVIDER_SITE_OTHER): Admitting: Podiatry

## 2024-03-19 DIAGNOSIS — B351 Tinea unguium: Secondary | ICD-10-CM | POA: Diagnosis not present

## 2024-03-19 DIAGNOSIS — Z79899 Other long term (current) drug therapy: Secondary | ICD-10-CM

## 2024-03-19 DIAGNOSIS — M79675 Pain in left toe(s): Secondary | ICD-10-CM

## 2024-03-19 DIAGNOSIS — M79674 Pain in right toe(s): Secondary | ICD-10-CM | POA: Diagnosis not present

## 2024-03-19 NOTE — Progress Notes (Unsigned)
  Subjective:  Patient ID: Johnathan Flynn, male    DOB: 12-Jul-1950,  MRN: [id number]  Chief Complaint  Patient presents with   Onychomycosis/Med check    Follow up for onychomycosis. He states he is seeing improvement, and wants to know how much longer he will need to come in.  Not diabetic and ASA    73 y.o. male presents with the above complaint.  He comes in for ongoing treatment of onychomycosis.  He has completed about 5 months of oral Lamisil .  He has noticed some improvement of the toenails.  Still dealing with some residual changes to the bilateral hallux and right second toenail in particular. Objective:  Physical Exam: warm, good capillary refill nail exam dystrophy of the toenails with clubbing of the lesser digits, onychomycosis present.  Most affected toenails at this point are the bilateral first toenails, right second toenail with some residual changes distal 25% of the nail and dystrophy present distal medial aspect of the second toenail with significant nail thickening. DP pulses palpable, PT pulses palpable, and protective sensation intact Left Foot:  Pain with palpation of nails due to elongation and dystrophic growth.  Right Foot: Pain with palpation of nails due to elongation and dystrophic growth.    Assessment:   1. Encounter for long-term (current) use of high-risk medication   2. Pain due to onychomycosis of toenails of both feet      Plan:  Patient was evaluated and treated and all questions answered.  #Onychomycosis with pain  # Encounter for current use of long-term high-risk medication - Has shown continued improvement using the oral Lamisil  -Will recheck liver function panel and CBC, if there are no significant elevations, okay to complete the current course of medication -Will recheck in 1 month to see if there are residual nail changes, may transition to topical at that time if needed. - Did discuss that some of the nail changes could also be due to  microtrauma and that there could be residual nail thickening of the right second toenail in particular upon completion of the course.   Return in about 4 weeks (around 04/16/2024) for Onychomycosis/med check.         Ethan Saddler, DPM [facility] / [facility] - [facility]

## 2024-03-20 ENCOUNTER — Ambulatory Visit: Payer: Self-pay | Admitting: Podiatry

## 2024-03-20 LAB — HEPATIC FUNCTION PANEL
ALT: 18 IU/L (ref 0–44)
AST: 18 IU/L (ref 0–40)
Albumin: 4.3 g/dL (ref 3.8–4.8)
Alkaline Phosphatase: 56 IU/L (ref 47–123)
Bilirubin Total: 0.3 mg/dL (ref 0.0–1.2)
Bilirubin, Direct: 0.09 mg/dL (ref 0.00–0.40)
Total Protein: 6.3 g/dL (ref 6.0–8.5)

## 2024-03-20 LAB — CBC
Hematocrit: 40.9 % (ref 37.5–51.0)
Hemoglobin: 13.3 g/dL (ref 13.0–17.7)
MCH: 30.4 pg (ref 26.6–33.0)
MCHC: 32.5 g/dL (ref 31.5–35.7)
MCV: 94 fL (ref 79–97)
Platelets: 118 x10E3/uL — ABNORMAL LOW (ref 150–450)
RBC: 4.37 x10E6/uL (ref 4.14–5.80)
RDW: 12.6 % (ref 11.6–15.4)
WBC: 3.6 x10E3/uL (ref 3.4–10.8)

## 2024-04-16 ENCOUNTER — Ambulatory Visit: Admitting: Podiatry

## 2024-04-16 ENCOUNTER — Encounter: Payer: Self-pay | Admitting: Podiatry

## 2024-04-16 DIAGNOSIS — B351 Tinea unguium: Secondary | ICD-10-CM | POA: Diagnosis not present

## 2024-04-16 DIAGNOSIS — M79675 Pain in left toe(s): Secondary | ICD-10-CM | POA: Diagnosis not present

## 2024-04-16 DIAGNOSIS — M2042 Other hammer toe(s) (acquired), left foot: Secondary | ICD-10-CM | POA: Diagnosis not present

## 2024-04-16 DIAGNOSIS — M2041 Other hammer toe(s) (acquired), right foot: Secondary | ICD-10-CM | POA: Diagnosis not present

## 2024-04-16 DIAGNOSIS — M79674 Pain in right toe(s): Secondary | ICD-10-CM

## 2024-04-16 NOTE — Patient Instructions (Addendum)
 More silicone pads can be purchased from:  [email or web address]  Look for crest pads or gel toe caps. You can also find these on amazon

## 2024-04-16 NOTE — Progress Notes (Signed)
  Subjective:  Patient ID: Johnathan Flynn, male    DOB: [birth year],  MRN: [id number]  Chief Complaint  Patient presents with   FUngal nail Check    Fungal nail check. Took the last dose of Lamisil  last Thursday.  He is also having some tenderness on the tips of the left foot toes 3 and 4. Thanks it could be from the shoes.     Discussed the use of AI scribe software for clinical note transcription with the patient, who gave verbal consent to proceed.  History of Present Illness Johnathan Flynn is a 74 year old male who presents with left foot toe pain.  He experiences pain in the second and third toes of his left foot at the tips, occurring only when wearing shoes, particularly ill-fitting ones. No pain is present when not wearing shoes. He has been experimenting with different shoes to alleviate the pain, preferring extra wide shoes to avoid pressure on his toes. His most comfortable shoes are an old pair of Skechers, which are no longer available.  He completed a six-month course of terbinafine  for a fungal nail infection, with the last dose taken on Thursday. The condition has improved, but there is residual nail discoloration and chronic thickness in the second toenail.      Objective:    Physical Exam MUSCULOSKELETAL: Mild clawing at the distal interphalangeal joint level of the affected toes on weight bearing.   No images are attached to the encounter.    Results    Assessment:  No diagnosis found.   Plan:  Patient was evaluated and treated and all questions answered.  Assessment and Plan Assessment & Plan Hammer toe deformity of left second and third toes with mild clawing at DIPJ Mild hammer toe deformity with clawing at DIPJ, exacerbated by ill-fitting shoes and toe gait. Muscle imbalance is a potential cause. Surgical intervention considered if conservative measures fail. - Recommended extra wide shoes with adequate toe room. - Provided gel toe caps and crest  pads. - Advised experimenting with different shoe options and toe pads. - Discussed potential for surgical intervention if symptoms persist.  Onychomycosis of toenails, resolved after terbinafine  therapy with residual nail changes Onychomycosis resolved post-terbinafine  therapy. Residual nail changes due to long-term damage. No further antifungal treatment needed unless symptoms recur. - Monitor for recurrence of symptoms or pain. - Consider toenail removal if chronic thickness or pain persists.      No follow-ups on file.

## 2024-04-22 ENCOUNTER — Ambulatory Visit (INDEPENDENT_AMBULATORY_CARE_PROVIDER_SITE_OTHER): Admitting: Family Medicine

## 2024-04-22 ENCOUNTER — Ambulatory Visit: Payer: Self-pay

## 2024-04-22 VITALS — BP 148/90 | HR 63 | Temp 98.0°F | Resp 18 | Ht 67.5 in | Wt 228.4 lb

## 2024-04-22 DIAGNOSIS — I10 Essential (primary) hypertension: Secondary | ICD-10-CM | POA: Diagnosis not present

## 2024-04-22 DIAGNOSIS — Z122 Encounter for screening for malignant neoplasm of respiratory organs: Secondary | ICD-10-CM

## 2024-04-22 DIAGNOSIS — K219 Gastro-esophageal reflux disease without esophagitis: Secondary | ICD-10-CM | POA: Diagnosis not present

## 2024-04-22 DIAGNOSIS — E559 Vitamin D deficiency, unspecified: Secondary | ICD-10-CM | POA: Diagnosis not present

## 2024-04-22 DIAGNOSIS — R042 Hemoptysis: Secondary | ICD-10-CM | POA: Diagnosis not present

## 2024-04-22 DIAGNOSIS — E782 Mixed hyperlipidemia: Secondary | ICD-10-CM | POA: Diagnosis not present

## 2024-04-22 DIAGNOSIS — D696 Thrombocytopenia, unspecified: Secondary | ICD-10-CM

## 2024-04-22 DIAGNOSIS — Z87898 Personal history of other specified conditions: Secondary | ICD-10-CM | POA: Diagnosis not present

## 2024-04-22 DIAGNOSIS — Z716 Tobacco abuse counseling: Secondary | ICD-10-CM | POA: Diagnosis not present

## 2024-04-22 MED ORDER — PANTOPRAZOLE SODIUM 40 MG PO TBEC: 40.0000 mg | DELAYED_RELEASE_TABLET | Freq: Every day | ORAL | 3 refills | Status: DC

## 2024-04-22 NOTE — Progress Notes (Unsigned)
 Subjective:  Patient ID: Johnathan Flynn, male    DOB: [birth year]  Age: 73 y.o. MRN: [id number]  Chief Complaint  Patient presents with   Hemoptysis   Discussed the use of AI scribe software for clinical note transcription with the patient, who gave verbal consent to proceed.  History of Present Illness   Johnathan Flynn is a 74 year old male with prediabetes and hypertension who presents with hemoptysis and concerns about his platelet count.  Hemoptysis - Hemoptysis since Friday, characterized by spitting up blood with deep coughing ('guy's cough') - Most recent episode less severe than prior episodes - Associates hemoptysis with acid reflux - No mention of associated chest pain, shortness of breath, or palpitations  Gastroesophageal reflux symptoms - History of acid reflux - Takes over-the-counter medication for acid reflux (unsure of name)  Thrombocytopenia - History of decreasing platelet counts - Recent platelet count decreased from 149 to 118 - Concerned about implications of thrombocytopenia, especially in context of recent hemoptysis  Injection site reaction and periorbital swelling - Received influenza vaccination on November 4th at [facility] - Blister-like reaction at injection site - Swelling and redness under left eye following vaccination, significant enough to require sunglasses in public  Hypertension - History of hypertension - Takes losartan  50 mg daily, sometimes skips doses - Did not take losartan  today due to not having eaten  Prediabetes and weight gain - History of prediabetes - Last hemoglobin A1c in August 2024 was 5.6 - Borderline diabetic in the past - Recent deterioration in eating habits and weight gain - Continues to exercise at the gym, but less frequently than before  Hyperlipidemia - Takes red yeast rice for cholesterol management  Carotid artery stenosis and antiplatelet therapy - History of carotid artery stenosis - Takes aspirin 81 mg  daily for cardiovascular health  Tobacco use - History of cigar smoking for over 40 years - Recently stopped smoking cigars for 48 hours due to health concerns  Vitamin supplementation - Takes vitamin D2 weekly            01/27/2023   12:02 PM 06/17/2021    3:12 PM  Depression screen PHQ 2/9  Decreased Interest 0 0  Down, Depressed, Hopeless 0 0  PHQ - 2 Score 0 0  Altered sleeping 2   Tired, decreased energy 1   Change in appetite 0   Feeling bad or failure about yourself  0   Trouble concentrating 0   Moving slowly or fidgety/restless 0   Suicidal thoughts 0   PHQ-9 Score 3    Difficult doing work/chores Not difficult at all      Data saved with a previous flowsheet row definition        01/11/2024   10:02 AM  Fall Risk   Falls in the past year? 1  Number falls in past yr: 0  Injury with Fall? 0    Patient Care Team: Teressa Harrie HERO, FNP as PCP - General (Family Medicine)   Review of Systems  Constitutional:  Negative for appetite change, fatigue and fever.  HENT:  Negative for congestion, ear pain, sinus pressure and sore throat.   Eyes: Negative.   Respiratory:  Negative for cough, chest tightness, shortness of breath and wheezing.   Cardiovascular:  Negative for chest pain and palpitations.  Gastrointestinal:  Negative for abdominal pain, constipation, diarrhea, nausea and vomiting.  Endocrine: Negative.   Genitourinary:  Negative for dysuria, frequency, hematuria and urgency.  Musculoskeletal:  Negative for  arthralgias, back pain, joint swelling and myalgias.  Skin:  Negative for rash.  Allergic/Immunologic: Negative.   Neurological:  Negative for dizziness, weakness, light-headedness and headaches.  Psychiatric/Behavioral:  Negative for dysphoric mood. The patient is not nervous/anxious.     Current Outpatient Medications on File Prior to Visit  Medication Sig Dispense Refill   aspirin EC 81 MG tablet Take 81 mg by mouth daily. Swallow whole.      losartan  (COZAAR ) 50 MG tablet TAKE 1 TABLET BY MOUTH EVERY DAY 90 tablet 0   Multiple Vitamin (MULTIVITAMIN) tablet Take 1 tablet by mouth daily.     omega-3 fish oil (MAXEPA) 1000 MG CAPS capsule Take by mouth.     Red Yeast Rice 600 MG TABS      tadalafil (CIALIS) 5 MG tablet      tamsulosin (FLOMAX) 0.4 MG CAPS capsule      Vitamin D , Ergocalciferol , (DRISDOL ) 1.25 MG (50000 UNIT) CAPS capsule Take 50,000 Units by mouth once a week.     No current facility-administered medications on file prior to visit.   Past Medical History:  Diagnosis Date   Hyperlipidemia    Hypertension    Need for hepatitis C screening test 04/02/2022   Prediabetes    Past Surgical History:  Procedure Laterality Date   EYE SURGERY      Family History  Problem Relation Age of Onset   Alcoholism Paternal Grandfather    Social History   Socioeconomic History   Marital status: Widowed    Spouse name: Not on file   Number of children: Not on file   Years of education: Not on file   Highest education level: 12th grade  Occupational History    Comment: Retired. Used to be merchandiser, retail in [place].  Tobacco Use   Smoking status: Every Day    Types: Cigars   Smokeless tobacco: Not on file  Substance and Sexual Activity   Alcohol use: Not on file   Drug use: Never   Sexual activity: Yes    Comment: one partner  Other Topics Concern   Not on file  Social History Narrative   Widowed. Girlfriend.    Children:    Coffee 2-4 cups per day.    Social Drivers of Corporate Investment Banker Strain: Low Risk  (04/22/2024)   Overall Financial Resource Strain (CARDIA)    Difficulty of Paying Living Expenses: Not hard at all  Food Insecurity: No Food Insecurity (04/22/2024)   Hunger Vital Sign    Worried About Running Out of Food in the Last Year: Never true    Ran Out of Food in the Last Year: Never true  Transportation Needs: No Transportation Needs (04/22/2024)   PRAPARE - Scientist, Research (physical Sciences) (Medical): No    Lack of Transportation (Non-Medical): No  Physical Activity: Insufficiently Active (04/22/2024)   Exercise Vital Sign    Days of Exercise per Week: 2 days    Minutes of Exercise per Session: 30 min  Stress: No Stress Concern Present (04/22/2024)   [employer] of Occupational Health - Occupational Stress Questionnaire    Feeling of Stress: Only a little  Social Connections: Socially Isolated (04/22/2024)   Social Connection and Isolation Panel    Frequency of Communication with Friends and Family: More than three times a week    Frequency of Social Gatherings with Friends and Family: Twice a week    Attends Religious Services: Patient declined    Active Member  of Clubs or Organizations: No    Attends Banker Meetings: Not on file    Marital Status: Widowed    Objective:  BP (!) 148/90   Pulse 63   Temp 98 F (36.7 C) (Temporal)   Resp 18   Ht 5' 7.5 (1.715 m)   Wt 228 lb 6.4 oz (103.6 kg)   SpO2 98%   BMI 35.24 kg/m      04/22/2024    2:18 PM 08/30/2023    2:06 PM 08/29/2023    2:33 PM  BP/Weight  Systolic BP 148 144 130  Diastolic BP 90 80 80  Wt. (Lbs) 228.4    BMI 35.24 kg/m2      Physical Exam Vitals reviewed.  Constitutional:      General: He is not in acute distress.    Appearance: Normal appearance.  Eyes:     Conjunctiva/sclera: Conjunctivae normal.  Cardiovascular:     Rate and Rhythm: Normal rate and regular rhythm.     Heart sounds: Normal heart sounds. No murmur heard. Pulmonary:     Effort: Pulmonary effort is normal.     Breath sounds: Normal breath sounds. No wheezing.  Abdominal:     General: Bowel sounds are normal.     Palpations: Abdomen is soft.  Skin:    General: Skin is warm.  Neurological:     Mental Status: He is alert and oriented to person, place, and time. Mental status is at baseline.  Psychiatric:        Mood and Affect: Mood normal.        Behavior: Behavior normal.      Lab Results  Component Value Date   WBC 4.8 04/22/2024   HGB 13.8 04/22/2024   HCT 42.5 04/22/2024   PLT 138 (L) 04/22/2024   GLUCOSE 90 04/22/2024   CHOL 160 04/22/2024   TRIG 66 04/22/2024   HDL 62 04/22/2024   LDLCALC 85 04/22/2024   ALT 25 04/22/2024   AST 23 04/22/2024   NA 139 04/22/2024   K 4.9 04/22/2024   CL 101 04/22/2024   CREATININE 1.00 04/22/2024   BUN 16 04/22/2024   CO2 25 04/22/2024   TSH 1.210 01/27/2023   HGBA1C 6.3 (H) 04/22/2024    Results for orders placed or performed in visit on 04/22/24  Lipid Panel   Collection Time: 04/22/24  3:30 PM  Result Value Ref Range   Cholesterol, Total 160 100 - 199 mg/dL   Triglycerides 66 0 - 149 mg/dL   HDL 62 >60 mg/dL   VLDL Cholesterol Cal 13 5 - 40 mg/dL   LDL Chol Calc (NIH) 85 0 - 99 mg/dL   Chol/HDL Ratio 2.6 0.0 - 5.0 ratio  Hemoglobin A1c   Collection Time: 04/22/24  3:30 PM  Result Value Ref Range   Hgb A1c MFr Bld 6.3 (H) 4.8 - 5.6 %   Est. average glucose Bld gHb Est-mCnc 134 mg/dL  CBC with Differential   Collection Time: 04/22/24  3:30 PM  Result Value Ref Range   WBC 4.8 3.4 - 10.8 x10E3/uL   RBC 4.64 4.14 - 5.80 x10E6/uL   Hemoglobin 13.8 13.0 - 17.7 g/dL   Hematocrit 57.4 62.4 - 51.0 %   MCV 92 79 - 97 fL   MCH 29.7 26.6 - 33.0 pg   MCHC 32.5 31.5 - 35.7 g/dL   RDW 87.2 88.3 - 84.5 %   Platelets 138 (L) 150 - 450 x10E3/uL   Neutrophils 62  Not Estab. %   Lymphs 22 Not Estab. %   Monocytes 12 Not Estab. %   Eos 2 Not Estab. %   Basos 1 Not Estab. %   Neutrophils Absolute 3.0 1.4 - 7.0 x10E3/uL   Lymphocytes Absolute 1.0 0.7 - 3.1 x10E3/uL   Monocytes Absolute 0.6 0.1 - 0.9 x10E3/uL   EOS (ABSOLUTE) 0.1 0.0 - 0.4 x10E3/uL   Basophils Absolute 0.0 0.0 - 0.2 x10E3/uL   Immature Granulocytes 0 Not Estab. %   Immature Grans (Abs) 0.0 0.0 - 0.1 x10E3/uL  Comprehensive metabolic panel with GFR   Collection Time: 04/22/24  3:30 PM  Result Value Ref Range   Glucose 90 70 - 99 mg/dL    BUN 16 8 - 27 mg/dL   Creatinine, Ser 8.99 0.76 - 1.27 mg/dL   eGFR 80 >40 fO/fpw/8.26   BUN/Creatinine Ratio 16 10 - 24   Sodium 139 134 - 144 mmol/L   Potassium 4.9 3.5 - 5.2 mmol/L   Chloride 101 96 - 106 mmol/L   CO2 25 20 - 29 mmol/L   Calcium 10.6 (H) 8.6 - 10.2 mg/dL   Total Protein 7.1 6.0 - 8.5 g/dL   Albumin 4.6 3.8 - 4.8 g/dL   Globulin, Total 2.5 1.5 - 4.5 g/dL   Bilirubin Total 0.5 0.0 - 1.2 mg/dL   Alkaline Phosphatase 68 47 - 123 IU/L   AST 23 0 - 40 IU/L   ALT 25 0 - 44 IU/L  Vitamin D , 25-hydroxy   Collection Time: 04/22/24  3:30 PM  Result Value Ref Range   Vit D, 25-Hydroxy 46.7 30.0 - 100.0 ng/mL  .  Assessment & Plan:   Assessment & Plan Coughing up blood Recent flecks of blood in the morning productive cough. Differentials include possible lung disease, low platelet count, or GERD. - order labs and CT scan of the lungs, Await labs/testing for assessment and recommendations    Thrombocytopenia Thrombocytopenia Platelet count decreasing, currently at 118. Possible bleeding risk. - Ordered CBC to assess current platelet count. - Referred to hematology for further evaluation. Orders:   CBC with Differential   Ambulatory referral to Hematology / Oncology  Gastroesophageal reflux disease without esophagitis Gastroesophageal reflux disease Experiencing acid reflux symptoms, including spitting up blood. Symptoms may be exacerbated by dietary habits and smoking. - Prescribed pantoprazole for reflux management. - Advised dietary modifications to avoid triggers. Orders:   pantoprazole (PROTONIX) 40 MG tablet; Take 1 tablet (40 mg total) by mouth daily.  History of prediabetes Last A1c was 5.6. Interested in Ozempic for weight management and diabetes prevention. - Ordered A1c test to assess current glycemic control. - labs drawn Lab Results  Component Value Date   HGBA1C 6.3 (H) 04/22/2024  Orders:   Hemoglobin A1c  Essential hypertension,  benign Hypertension Not well controlled BP Readings from Last 3 Encounters:  04/22/24 (!) 148/90  08/30/23 (!) 144/80  08/29/23 130/80  - Takes losartan  50 mg daily, sometimes skips doses - Did not take losartan  today due to not having eaten Orders:   Comprehensive metabolic panel with GFR  Mixed hyperlipidemia Chronic, Currently not taking any medications, prefers to work use supplements and lifestyle changes. Patient currently taking fish oil and red rice yeast.  Lab Results  Component Value Date   LDLCALC 85 04/22/2024  - labs drawn, Await labs/testing for assessment and recommendations Orders:   Lipid Panel  Vitamin D  deficiency Vitamin D  deficiency Currently taking vitamin D2 supplementation. - Continue vitamin D2 supplementation.  Orders:   Vitamin D , 25-hydroxy  Morbid obesity (HCC) BMI 35.24, not at goal Comorbidities - Hypertension, Hyperlipidemia and prediabetes - Recommend continue working on diet and exercise     Screening for lung cancer Screening for lung cancer Long history of smoking increases lung cancer risk. No prior lung CT performed. - Ordered low-dose CT scan for lung cancer screening.  Orders:   CT CHEST LUNG CA SCREEN LOW DOSE W/O CM; Future  Encounter for smoking cessation counseling Tobacco use disorder Recently quit smoking cigars. Smoking cessation crucial for health improvement. - Encouraged continued smoking cessation. - Discussed potential benefits of smoking cessation.     Follow-up: Return in about 2 weeks (around 05/06/2024) for nurse visit, BP recheck, med check.  An After Visit Summary was printed and given to the patient.  Harrie Cedar, FNP [facility] [phone]

## 2024-04-22 NOTE — Telephone Encounter (Signed)
 FYI Only or Action Required?: FYI only for provider: appointment scheduled on 04/22/2024.  Patient was last seen in primary care on 08/29/2023 by Teressa Harrie HERO, FNP.  Called Nurse Triage reporting Hemoptysis.  Symptoms began several days ago.  Interventions attempted: Nothing.  Symptoms are: unchanged.  Triage Disposition: See PCP When Office is Open (Within 3 Days)  Patient/caregiver understands and will follow disposition?: Yes  Copied from CRM #[id number]. Topic: Clinical - Red Word Triage >> Apr 22, 2024  9:23 AM Robinson H wrote: [facility] that prompted transfer to Nurse Triage: Coughing up bits of blood last few mornings Reason for Disposition  [1] More than one episode of coughing up blood AND [2] < 1 tablespoon (15 ml) of pure red blood  Answer Assessment - Initial Assessment Questions Mild underlying intermittent cough that patient attributes to cigar smoking  1. ONSET: When did the cough begin?     Friday, three days ago 2. SEVERITY: How bad is the cough today? Did the blood appear after a coughing spell?      Mild cough, but deep coughs result in hemoptysis 3. SPUTUM: Describe the color of your sputum (e.g., none, dry cough; clear, white, yellow, green)     Normally white 4. HEMOPTYSIS: How much blood? (e.g., flecks, streaks, tablespoons)     flecks 5. DIFFICULTY BREATHING: Are you having difficulty breathing? If Yes, ask: How bad is it? (e.g., mild, moderate, severe)      denies 6. FEVER: Do you have a fever? If Yes, ask: What is your temperature, how was it measured, and when did it start?     denies 7. CARDIAC HISTORY: Do you have any history of heart disease? (e.g., heart attack, congestive heart failure)      HTN 8. LUNG HISTORY: Do you have any history of lung disease?  (e.g., pulmonary embolus, asthma, emphysema)     No previous history   11. PREGNANCY: Is there any chance you are pregnant? When was your last menstrual period?        N/a 12. TRAVEL: Have you traveled out of the country in the last month? (e.g., travel history, exposures)       denies  Protocols used: Coughing Up Blood-A-AH

## 2024-04-23 LAB — COMPREHENSIVE METABOLIC PANEL WITH GFR
ALT: 25 IU/L (ref 0–44)
AST: 23 IU/L (ref 0–40)
Albumin: 4.6 g/dL (ref 3.8–4.8)
Alkaline Phosphatase: 68 IU/L (ref 47–123)
BUN/Creatinine Ratio: 16 (ref 10–24)
BUN: 16 mg/dL (ref 8–27)
Bilirubin Total: 0.5 mg/dL (ref 0.0–1.2)
CO2: 25 mmol/L (ref 20–29)
Calcium: 10.6 mg/dL — ABNORMAL HIGH (ref 8.6–10.2)
Chloride: 101 mmol/L (ref 96–106)
Creatinine, Ser: 1 mg/dL (ref 0.76–1.27)
Globulin, Total: 2.5 g/dL (ref 1.5–4.5)
Glucose: 90 mg/dL (ref 70–99)
Potassium: 4.9 mmol/L (ref 3.5–5.2)
Sodium: 139 mmol/L (ref 134–144)
Total Protein: 7.1 g/dL (ref 6.0–8.5)
eGFR: 80 mL/min/1.73 (ref >59)

## 2024-04-23 LAB — CBC WITH DIFFERENTIAL/PLATELET
Basophils Absolute: 0 x10E3/uL (ref 0.0–0.2)
Basos: 1 %
EOS (ABSOLUTE): 0.1 x10E3/uL (ref 0.0–0.4)
Eos: 2 %
Hematocrit: 42.5 % (ref 37.5–51.0)
Hemoglobin: 13.8 g/dL (ref 13.0–17.7)
Immature Grans (Abs): 0 x10E3/uL (ref 0.0–0.1)
Immature Granulocytes: 0 %
Lymphocytes Absolute: 1 x10E3/uL (ref 0.7–3.1)
Lymphs: 22 %
MCH: 29.7 pg (ref 26.6–33.0)
MCHC: 32.5 g/dL (ref 31.5–35.7)
MCV: 92 fL (ref 79–97)
Monocytes Absolute: 0.6 x10E3/uL (ref 0.1–0.9)
Monocytes: 12 %
Neutrophils Absolute: 3 x10E3/uL (ref 1.4–7.0)
Neutrophils: 62 %
Platelets: 138 x10E3/uL — ABNORMAL LOW (ref 150–450)
RBC: 4.64 x10E6/uL (ref 4.14–5.80)
RDW: 12.7 % (ref 11.6–15.4)
WBC: 4.8 x10E3/uL (ref 3.4–10.8)

## 2024-04-23 LAB — LIPID PANEL
Chol/HDL Ratio: 2.6 ratio (ref 0.0–5.0)
Cholesterol, Total: 160 mg/dL (ref 100–199)
HDL: 62 mg/dL (ref >39)
LDL Chol Calc (NIH): 85 mg/dL (ref 0–99)
Triglycerides: 66 mg/dL (ref 0–149)
VLDL Cholesterol Cal: 13 mg/dL (ref 5–40)

## 2024-04-23 LAB — VITAMIN D 25 HYDROXY (VIT D DEFICIENCY, FRACTURES): Vit D, 25-Hydroxy: 46.7 ng/mL (ref 30.0–100.0)

## 2024-04-23 LAB — HEMOGLOBIN A1C
Est. average glucose Bld gHb Est-mCnc: 134 mg/dL
Hgb A1c MFr Bld: 6.3 % — ABNORMAL HIGH (ref 4.8–5.6)

## 2024-04-24 ENCOUNTER — Other Ambulatory Visit: Payer: Self-pay | Admitting: Hematology and Oncology

## 2024-04-24 ENCOUNTER — Ambulatory Visit: Payer: Self-pay | Admitting: Family Medicine

## 2024-04-24 DIAGNOSIS — E559 Vitamin D deficiency, unspecified: Secondary | ICD-10-CM | POA: Insufficient documentation

## 2024-04-24 DIAGNOSIS — D696 Thrombocytopenia, unspecified: Secondary | ICD-10-CM

## 2024-04-24 DIAGNOSIS — Z87898 Personal history of other specified conditions: Secondary | ICD-10-CM | POA: Insufficient documentation

## 2024-04-24 DIAGNOSIS — R042 Hemoptysis: Secondary | ICD-10-CM | POA: Insufficient documentation

## 2024-04-24 DIAGNOSIS — Z716 Tobacco abuse counseling: Secondary | ICD-10-CM | POA: Insufficient documentation

## 2024-04-24 DIAGNOSIS — K219 Gastro-esophageal reflux disease without esophagitis: Secondary | ICD-10-CM | POA: Insufficient documentation

## 2024-04-24 DIAGNOSIS — J984 Other disorders of lung: Secondary | ICD-10-CM

## 2024-04-24 DIAGNOSIS — Z122 Encounter for screening for malignant neoplasm of respiratory organs: Secondary | ICD-10-CM | POA: Insufficient documentation

## 2024-04-24 NOTE — Assessment & Plan Note (Addendum)
 Hypertension Not well controlled BP Readings from Last 3 Encounters:  04/22/24 (!) 148/90  08/30/23 (!) 144/80  08/29/23 130/80  - Takes losartan  50 mg daily, sometimes skips doses - Did not take losartan  today due to not having eaten Orders:   Comprehensive metabolic panel with GFR

## 2024-04-24 NOTE — Assessment & Plan Note (Addendum)
 Last A1c was 5.6. Interested in Ozempic for weight management and diabetes prevention. - Ordered A1c test to assess current glycemic control. - labs drawn Lab Results  Component Value Date   HGBA1C 6.3 (H) 04/22/2024  Orders:   Hemoglobin A1c

## 2024-04-24 NOTE — Assessment & Plan Note (Addendum)
 Thrombocytopenia Platelet count decreasing, currently at 118. Possible bleeding risk. - Ordered CBC to assess current platelet count. - Referred to hematology for further evaluation. Orders:   CBC with Differential   Ambulatory referral to Hematology / Oncology

## 2024-04-24 NOTE — Assessment & Plan Note (Addendum)
 Screening for lung cancer Long history of smoking increases lung cancer risk. No prior lung CT performed. - Ordered low-dose CT scan for lung cancer screening.  Orders:   CT CHEST LUNG CA SCREEN LOW DOSE W/O CM; Future

## 2024-04-24 NOTE — Assessment & Plan Note (Signed)
 Vitamin D  deficiency Currently taking vitamin D2 supplementation. - Continue vitamin D2 supplementation. Orders:   Vitamin D , 25-hydroxy

## 2024-04-24 NOTE — Assessment & Plan Note (Signed)
 Tobacco use disorder Recently quit smoking cigars. Smoking cessation crucial for health improvement. - Encouraged continued smoking cessation. - Discussed potential benefits of smoking cessation.

## 2024-04-24 NOTE — Assessment & Plan Note (Addendum)
 BMI 35.24, not at goal Comorbidities - Hypertension, Hyperlipidemia and prediabetes - Recommend continue working on diet and exercise

## 2024-04-24 NOTE — Assessment & Plan Note (Addendum)
 Gastroesophageal reflux disease Experiencing acid reflux symptoms, including spitting up blood. Symptoms may be exacerbated by dietary habits and smoking. - Prescribed pantoprazole for reflux management. - Advised dietary modifications to avoid triggers. Orders:   pantoprazole (PROTONIX) 40 MG tablet; Take 1 tablet (40 mg total) by mouth daily.

## 2024-04-24 NOTE — Addendum Note (Signed)
 Addended by: TERESSA HARRIE HERO on: 04/24/2024 03:03 PM   Modules accepted: Orders

## 2024-04-24 NOTE — Assessment & Plan Note (Addendum)
 Chronic, Currently not taking any medications, prefers to work use supplements and lifestyle changes. Patient currently taking fish oil and red rice yeast.  Lab Results  Component Value Date   LDLCALC 85 04/22/2024  - labs drawn, Await labs/testing for assessment and recommendations Orders:   Lipid Panel

## 2024-04-24 NOTE — Assessment & Plan Note (Addendum)
 Recent flecks of blood in the morning productive cough. Differentials include possible lung disease, low platelet count, or GERD. - order labs and CT scan of the lungs, Await labs/testing for assessment and recommendations

## 2024-04-25 ENCOUNTER — Inpatient Hospital Stay: Attending: Hematology and Oncology | Admitting: Hematology and Oncology

## 2024-04-25 ENCOUNTER — Telehealth: Payer: Self-pay | Admitting: Hematology and Oncology

## 2024-04-25 ENCOUNTER — Other Ambulatory Visit: Payer: Self-pay

## 2024-04-25 ENCOUNTER — Inpatient Hospital Stay

## 2024-04-25 ENCOUNTER — Encounter: Payer: Self-pay | Admitting: Hematology and Oncology

## 2024-04-25 VITALS — BP 155/70 | HR 62 | Resp 18 | Ht 67.5 in | Wt 233.3 lb

## 2024-04-25 DIAGNOSIS — D696 Thrombocytopenia, unspecified: Secondary | ICD-10-CM | POA: Diagnosis present

## 2024-04-25 DIAGNOSIS — Z7982 Long term (current) use of aspirin: Secondary | ICD-10-CM | POA: Diagnosis not present

## 2024-04-25 DIAGNOSIS — R042 Hemoptysis: Secondary | ICD-10-CM | POA: Insufficient documentation

## 2024-04-25 DIAGNOSIS — Z79899 Other long term (current) drug therapy: Secondary | ICD-10-CM | POA: Insufficient documentation

## 2024-04-25 LAB — IRON AND TIBC
Iron: 69 ug/dL (ref 45–182)
Saturation Ratios: 16 % — ABNORMAL LOW (ref 17.9–39.5)
TIBC: 433 ug/dL (ref 250–450)
UIBC: 364 ug/dL

## 2024-04-25 LAB — CMP (CANCER CENTER ONLY)
ALT: 21 U/L (ref 0–44)
AST: 20 U/L (ref 15–41)
Albumin: 4.2 g/dL (ref 3.5–5.0)
Alkaline Phosphatase: 64 U/L (ref 38–126)
Anion gap: 9 (ref 5–15)
BUN: 17 mg/dL (ref 8–23)
CO2: 24 mmol/L (ref 22–32)
Calcium: 10 mg/dL (ref 8.9–10.3)
Chloride: 105 mmol/L (ref 98–111)
Creatinine: 0.92 mg/dL (ref 0.61–1.24)
GFR, Estimated: >60 mL/min (ref >60)
Glucose, Bld: 112 mg/dL — ABNORMAL HIGH (ref 70–99)
Potassium: 4.4 mmol/L (ref 3.5–5.1)
Sodium: 138 mmol/L (ref 135–145)
Total Bilirubin: 0.4 mg/dL (ref 0.0–1.2)
Total Protein: 6.8 g/dL (ref 6.5–8.1)

## 2024-04-25 LAB — CBC WITH DIFFERENTIAL (CANCER CENTER ONLY)
Abs Immature Granulocytes: 0.03 K/uL (ref 0.00–0.07)
Basophils Absolute: 0 K/uL (ref 0.0–0.1)
Basophils Relative: 1 %
Eosinophils Absolute: 0.1 K/uL (ref 0.0–0.5)
Eosinophils Relative: 2 %
HCT: 38.6 % — ABNORMAL LOW (ref 39.0–52.0)
Hemoglobin: 13.2 g/dL (ref 13.0–17.0)
Immature Granulocytes: 1 %
Lymphocytes Relative: 27 %
Lymphs Abs: 0.9 K/uL (ref 0.7–4.0)
MCH: 30.2 pg (ref 26.0–34.0)
MCHC: 34.2 g/dL (ref 30.0–36.0)
MCV: 88.3 fL (ref 80.0–100.0)
Monocytes Absolute: 0.4 K/uL (ref 0.1–1.0)
Monocytes Relative: 11 %
Neutro Abs: 2 K/uL (ref 1.7–7.7)
Neutrophils Relative %: 58 %
Platelet Count: 129 K/uL — ABNORMAL LOW (ref 150–400)
RBC: 4.37 MIL/uL (ref 4.22–5.81)
RDW: 13.1 % (ref 11.5–15.5)
WBC Count: 3.4 K/uL — ABNORMAL LOW (ref 4.0–10.5)
nRBC: 0 % (ref 0.0–0.2)

## 2024-04-25 LAB — FERRITIN: Ferritin: 15 ng/mL — ABNORMAL LOW (ref 24–336)

## 2024-04-25 LAB — FOLATE: Folate: >20 ng/mL (ref >5.9)

## 2024-04-25 LAB — VITAMIN B12: Vitamin B-12: 502 pg/mL (ref 180–914)

## 2024-04-25 LAB — TSH: TSH: 1.48 u[IU]/mL (ref 0.350–4.500)

## 2024-04-25 NOTE — Progress Notes (Signed)
 " [facility] [address] [place],  [place]  [address] [phone]  Clinic Day:  04/25/2024   Referring physician: Teressa Harrie HERO, FNP  Patient Care Team: Patient Care Team: Teressa Harrie HERO, FNP as PCP - General (Family Medicine)   REASON FOR CONSULTATION:   Thrombocytopenia HISTORY OF PRESENT ILLNESS:   Johnathan Flynn is a 73 y.o. male with a history of thrombocytopenia who is referred in consultation by [name], FNP for assessment and management. He has a history of hemoptysis with deep cough. He was noted to have a decreasing platelet count from 186 to 118. He denies fever, chills, nausea or vomiting. He denies shortness of breath or chest pain. He denies issue with bowel or bladder. He denies changes in appetite or weight. He currently goes to the gym and is trying to eat healthier. Medical, surgical and family history as below. He is a current smoker and does drink alcohol. He denies use of other drugs.    REVIEW OF SYSTEMS:   Review of Systems  Constitutional: Negative.   HENT:  Negative.    Eyes: Negative.   Respiratory: Negative.    Cardiovascular: Negative.   Gastrointestinal: Negative.   Endocrine: Negative.   Genitourinary: Negative.    Musculoskeletal: Negative.   Skin: Negative.   Neurological: Negative.   Hematological: Negative.   Psychiatric/Behavioral: Negative.       VITALS:   There were no vitals taken for this visit.  Wt Readings from Last 3 Encounters:  04/22/24 228 lb 6.4 oz (103.6 kg)  08/29/23 212 lb 3.2 oz (96.3 kg)  07/04/23 221 lb (100.2 kg)    There is no height or weight on file to calculate BMI.  Performance status (ECOG): 1 - Symptomatic but completely ambulatory  PHYSICAL EXAM:   Physical Exam Constitutional:      Appearance: He is obese.  HENT:     Head: Normocephalic and atraumatic.     Mouth/Throat:     Mouth: Mucous membranes are moist.  Cardiovascular:     Rate and Rhythm: Normal rate and  regular rhythm.     Pulses: Normal pulses.     Heart sounds: Normal heart sounds.  Pulmonary:     Effort: Pulmonary effort is normal.     Breath sounds: Normal breath sounds.  Abdominal:     General: Bowel sounds are normal.     Palpations: Abdomen is soft.  Musculoskeletal:        General: Normal range of motion.  Skin:    General: Skin is warm and dry.  Neurological:     General: No focal deficit present.     Mental Status: He is alert and oriented to person, place, and time. Mental status is at baseline.  Psychiatric:        Mood and Affect: Mood normal.        Behavior: Behavior normal.        Thought Content: Thought content normal.        Judgment: Judgment normal.      LABS:      Latest Ref Rng & Units 04/25/2024    1:13 PM 04/22/2024    3:30 PM 03/19/2024    3:33 PM  CBC  WBC 4.0 - 10.5 K/uL 3.4  4.8  3.6   Hemoglobin 13.0 - 17.0 g/dL 86.7  86.1  86.6   Hematocrit 39.0 - 52.0 % 38.6  42.5  40.9   Platelets 150 - 400 K/uL 129  138  118       Latest Ref Rng & Units 04/25/2024    1:13 PM 04/22/2024    3:30 PM 03/19/2024    3:33 PM  CMP  Glucose 70 - 99 mg/dL 887  90    BUN 8 - 23 mg/dL 17  16    Creatinine 9.38 - 1.24 mg/dL 9.07  8.99    Sodium 864 - 145 mmol/L 138  139    Potassium 3.5 - 5.1 mmol/L 4.4  4.9    Chloride 98 - 111 mmol/L 105  101    CO2 22 - 32 mmol/L 24  25    Calcium 8.9 - 10.3 mg/dL 89.9  89.3    Total Protein 6.5 - 8.1 g/dL 6.8  7.1  6.3   Total Bilirubin 0.0 - 1.2 mg/dL 0.4  0.5  0.3   Alkaline Phos 38 - 126 U/L 64  68  56   AST 15 - 41 U/L 20  23  18    ALT 0 - 44 U/L 21  25  18       No results found for: CEA1, CEA / No results found for: CEA1, CEA Lab Results  Component Value Date   PSA1 2.4 01/27/2023   No results found for: CAN199 No results found for: CAN125  No results found for: TOTALPROTELP, ALBUMINELP, A1GS, A2GS, BETS, BETA2SER, GAMS, MSPIKE, SPEI No results found for: TIBC, FERRITIN,  IRONPCTSAT No results found for: LDH  STUDIES:   No results found.    HISTORY:   Past Medical History:  Diagnosis Date   Hyperlipidemia    Hypertension    Need for hepatitis C screening test 04/02/2022   Prediabetes     Past Surgical History:  Procedure Laterality Date   EYE SURGERY      Family History  Problem Relation Age of Onset   Alcoholism Paternal Grandfather     Social History:  reports that he has been smoking cigars. He does not have any smokeless tobacco history on file. He reports current alcohol use. He reports that he does not use drugs.The patient is alone  today.  Allergies: No Known Allergies  Current Medications: Current Outpatient Medications  Medication Sig Dispense Refill   aspirin EC 81 MG tablet Take 81 mg by mouth daily. Swallow whole.     losartan  (COZAAR ) 50 MG tablet TAKE 1 TABLET BY MOUTH EVERY DAY 90 tablet 0   Multiple Vitamin (MULTIVITAMIN) tablet Take 1 tablet by mouth daily.     omega-3 fish oil (MAXEPA) 1000 MG CAPS capsule Take by mouth.     pantoprazole  (PROTONIX ) 40 MG tablet Take 1 tablet (40 mg total) by mouth daily. 30 tablet 3   Red Yeast Rice 600 MG TABS Take 2 tablets by mouth daily at 12 noon.     tadalafil (CIALIS) 5 MG tablet      tamsulosin (FLOMAX) 0.4 MG CAPS capsule      Vitamin D , Ergocalciferol , (DRISDOL ) 1.25 MG (50000 UNIT) CAPS capsule Take 50,000 Units by mouth once a week.     No current facility-administered medications for this visit.     ASSESSMENT & PLAN:   Assessment:  Johnathan Flynn is a 73 y.o. male with history of hemoptysis and decreasing platelet count. He states that he has coughed up blood in the past and his most recent episode was less than previous episodes. CBC today reveals platelet count 129 with no signs or symptoms of overt bleeding. He is relatively asymptomatic.  He denies any shortness of breath or chest pain. He is moderately active and works out in [employer] most days. We discussed  that while his platelet count is decreased today, it is not at a dangerous level. We discussed potential causes including infection and bone marrow issues. Today, we will evaluate iron studies as well as Vitamin B12, folate, TSH and SPEP. We will discuss pending results at next visit.   Plan: 1.  Return to clinic in 2 weeks to discuss pending labs.   I discussed the assessment and treatment plan with the patient.  The patient was provided an opportunity to ask questions and all were answered.  The patient agreed with the plan and demonstrated an understanding of the instructions.    Thank you for the referral    45 minutes was spent in patient care.  This included time spent preparing to see the patient (e.g., review of tests), obtaining and/or reviewing separately obtained history, counseling and educating the patient/family/caregiver, ordering medications, tests, or procedures; documenting clinical information in the electronic or other health record, independently interpreting results and communicating results to the patient/family/caregiver as well as coordination of care.      Eleanor DELENA Bach, NP   Family Nurse Practitioner - Board Certified [facility] [place] [phone]     "

## 2024-04-25 NOTE — Telephone Encounter (Signed)
 Patient has been scheduled for follow-up visit per 04/25/24 LOS.  Pt aware of scheduled appt details.

## 2024-04-26 ENCOUNTER — Ambulatory Visit (HOSPITAL_BASED_OUTPATIENT_CLINIC_OR_DEPARTMENT_OTHER)
Admission: RE | Admit: 2024-04-26 | Discharge: 2024-04-26 | Disposition: A | Discharge status: Home / Self Care | Source: Ambulatory Visit | Attending: Family Medicine | Admitting: Family Medicine

## 2024-04-26 DIAGNOSIS — R918 Other nonspecific abnormal finding of lung field: Secondary | ICD-10-CM | POA: Diagnosis not present

## 2024-04-26 DIAGNOSIS — R042 Hemoptysis: Secondary | ICD-10-CM | POA: Diagnosis not present

## 2024-04-29 ENCOUNTER — Other Ambulatory Visit: Payer: Self-pay | Admitting: Family Medicine

## 2024-04-29 DIAGNOSIS — E559 Vitamin D deficiency, unspecified: Secondary | ICD-10-CM

## 2024-04-29 LAB — MULTIPLE MYELOMA PANEL, SERUM
Albumin SerPl Elph-Mcnc: 3.8 g/dL (ref 2.9–4.4)
Albumin/Glob SerPl: 1.4 (ref 0.7–1.7)
Alpha 1: 0.2 g/dL (ref 0.0–0.4)
Alpha2 Glob SerPl Elph-Mcnc: 0.8 g/dL (ref 0.4–1.0)
B-Globulin SerPl Elph-Mcnc: 0.9 g/dL (ref 0.7–1.3)
Gamma Glob SerPl Elph-Mcnc: 0.9 g/dL (ref 0.4–1.8)
Globulin, Total: 2.8 g/dL (ref 2.2–3.9)
IgA: 45 mg/dL — ABNORMAL LOW (ref 61–437)
IgG (Immunoglobin G), Serum: 953 mg/dL (ref 603–1613)
IgM (Immunoglobulin M), Srm: 32 mg/dL (ref 15–143)
Total Protein ELP: 6.6 g/dL (ref 6.0–8.5)

## 2024-04-30 MED ORDER — AMOXICILLIN-POT CLAVULANATE 875-125 MG PO TABS: 1.0000 | ORAL_TABLET | Freq: Two times a day (BID) | ORAL | 0 refills | Status: AC

## 2024-05-06 ENCOUNTER — Ambulatory Visit

## 2024-05-06 VITALS — BP 120/70

## 2024-05-06 DIAGNOSIS — I10 Essential (primary) hypertension: Secondary | ICD-10-CM

## 2024-05-06 NOTE — Progress Notes (Signed)
 Patient is in office today for a nurse visit for Blood Pressure Check and review medication. Patient blood pressure was 140/78 and after 10 minutes of sitting 120/70, Patient No chest pain, No shortness of breath, No dyspnea on exertion, No orthopnea, No paroxysmal nocturnal dyspnea, No edema, No palpitations, No syncope   Went over patient medication and medications has been updated in chart. Patient also stated that he quit smoking cigars on 04/21/2024.

## 2024-05-07 ENCOUNTER — Other Ambulatory Visit: Payer: Self-pay

## 2024-05-07 ENCOUNTER — Telehealth: Payer: Self-pay | Admitting: Hematology and Oncology

## 2024-05-07 ENCOUNTER — Inpatient Hospital Stay

## 2024-05-07 ENCOUNTER — Inpatient Hospital Stay: Admitting: Hematology and Oncology

## 2024-05-07 ENCOUNTER — Other Ambulatory Visit: Payer: Self-pay | Admitting: Hematology and Oncology

## 2024-05-07 DIAGNOSIS — D696 Thrombocytopenia, unspecified: Secondary | ICD-10-CM

## 2024-05-07 LAB — CBC WITH DIFFERENTIAL (CANCER CENTER ONLY)
Abs Immature Granulocytes: 0.01 K/uL (ref 0.00–0.07)
Basophils Absolute: 0 K/uL (ref 0.0–0.1)
Basophils Relative: 1 %
Eosinophils Absolute: 0.1 K/uL (ref 0.0–0.5)
Eosinophils Relative: 3 %
HCT: 36.6 % — ABNORMAL LOW (ref 39.0–52.0)
Hemoglobin: 12.3 g/dL — ABNORMAL LOW (ref 13.0–17.0)
Immature Granulocytes: 0 %
Lymphocytes Relative: 28 %
Lymphs Abs: 0.8 K/uL (ref 0.7–4.0)
MCH: 30 pg (ref 26.0–34.0)
MCHC: 33.6 g/dL (ref 30.0–36.0)
MCV: 89.3 fL (ref 80.0–100.0)
Monocytes Absolute: 0.3 K/uL (ref 0.1–1.0)
Monocytes Relative: 12 %
Neutro Abs: 1.5 K/uL — ABNORMAL LOW (ref 1.7–7.7)
Neutrophils Relative %: 56 %
Platelet Count: 117 K/uL — ABNORMAL LOW (ref 150–400)
RBC: 4.1 MIL/uL — ABNORMAL LOW (ref 4.22–5.81)
RDW: 13.2 % (ref 11.5–15.5)
WBC Count: 2.7 K/uL — ABNORMAL LOW (ref 4.0–10.5)
nRBC: 0 % (ref 0.0–0.2)

## 2024-05-07 NOTE — Telephone Encounter (Signed)
 Patient has been scheduled for follow-up visit per 05/07/2024 LOS.  Pt given an appt calendar with date and time.

## 2024-05-17 DIAGNOSIS — R339 Retention of urine, unspecified: Secondary | ICD-10-CM | POA: Diagnosis not present

## 2024-05-17 DIAGNOSIS — Z125 Encounter for screening for malignant neoplasm of prostate: Secondary | ICD-10-CM | POA: Diagnosis not present

## 2024-05-17 DIAGNOSIS — N401 Enlarged prostate with lower urinary tract symptoms: Secondary | ICD-10-CM | POA: Diagnosis not present

## 2024-05-17 DIAGNOSIS — N529 Male erectile dysfunction, unspecified: Secondary | ICD-10-CM | POA: Diagnosis not present

## 2024-05-17 DIAGNOSIS — R351 Nocturia: Secondary | ICD-10-CM | POA: Diagnosis not present

## 2024-06-04 ENCOUNTER — Other Ambulatory Visit: Payer: Self-pay

## 2024-06-04 DIAGNOSIS — I10 Essential (primary) hypertension: Secondary | ICD-10-CM

## 2024-06-04 NOTE — Progress Notes (Signed)
 Pharmacy Quality Measure Review  This patient is appearing on a report for being at risk of failing the adherence measure for hypertension (ACEi/ARB) medications this calendar year.   Medication: Losartan  50 mg Last fill date: 01/26/24 for 90 day supply  Patient supply should have ended around 05/27/24. In last PCP visit on 04/22/24, patient reported missing a few days of medication. Most recent prescription of Losartan  ran out of refills. Will collaborate with embedded pharmacist to send in refills.  Jenkins Graces, PharmD PGY1 Pharmacy Resident

## 2024-06-05 MED ORDER — LOSARTAN POTASSIUM 50 MG PO TABS: 50.0000 mg | ORAL_TABLET | Freq: Every day | ORAL | 3 refills | Status: AC

## 2024-06-10 ENCOUNTER — Other Ambulatory Visit: Payer: Self-pay | Admitting: Hematology and Oncology

## 2024-06-10 DIAGNOSIS — D696 Thrombocytopenia, unspecified: Secondary | ICD-10-CM

## 2024-06-11 ENCOUNTER — Encounter: Payer: Self-pay | Admitting: Hematology and Oncology

## 2024-06-11 ENCOUNTER — Inpatient Hospital Stay: Admitting: Hematology and Oncology

## 2024-06-11 ENCOUNTER — Inpatient Hospital Stay: Attending: Hematology and Oncology

## 2024-06-11 ENCOUNTER — Inpatient Hospital Stay

## 2024-06-11 ENCOUNTER — Telehealth: Payer: Self-pay | Admitting: Hematology and Oncology

## 2024-06-11 ENCOUNTER — Other Ambulatory Visit: Payer: Self-pay

## 2024-06-11 VITALS — BP 149/68 | HR 62 | Temp 98.2°F | Resp 20 | Ht 67.5 in | Wt 241.0 lb

## 2024-06-11 DIAGNOSIS — D696 Thrombocytopenia, unspecified: Secondary | ICD-10-CM | POA: Insufficient documentation

## 2024-06-11 DIAGNOSIS — F17290 Nicotine dependence, other tobacco product, uncomplicated: Secondary | ICD-10-CM | POA: Insufficient documentation

## 2024-06-11 DIAGNOSIS — D649 Anemia, unspecified: Secondary | ICD-10-CM | POA: Diagnosis not present

## 2024-06-11 DIAGNOSIS — Z79899 Other long term (current) drug therapy: Secondary | ICD-10-CM | POA: Diagnosis not present

## 2024-06-11 DIAGNOSIS — Z7982 Long term (current) use of aspirin: Secondary | ICD-10-CM | POA: Diagnosis not present

## 2024-06-11 LAB — CBC WITH DIFFERENTIAL (CANCER CENTER ONLY)
Abs Immature Granulocytes: 0.02 K/uL (ref 0.00–0.07)
Basophils Absolute: 0 K/uL (ref 0.0–0.1)
Basophils Relative: 1 %
Eosinophils Absolute: 0.1 K/uL (ref 0.0–0.5)
Eosinophils Relative: 2 %
HCT: 38.1 % — ABNORMAL LOW (ref 39.0–52.0)
Hemoglobin: 13 g/dL (ref 13.0–17.0)
Immature Granulocytes: 1 %
Lymphocytes Relative: 19 %
Lymphs Abs: 0.8 K/uL (ref 0.7–4.0)
MCH: 30.5 pg (ref 26.0–34.0)
MCHC: 34.1 g/dL (ref 30.0–36.0)
MCV: 89.4 fL (ref 80.0–100.0)
Monocytes Absolute: 0.4 K/uL (ref 0.1–1.0)
Monocytes Relative: 9 %
Neutro Abs: 3 K/uL (ref 1.7–7.7)
Neutrophils Relative %: 68 %
Platelet Count: 112 K/uL — ABNORMAL LOW (ref 150–400)
RBC: 4.26 MIL/uL (ref 4.22–5.81)
RDW: 13.9 % (ref 11.5–15.5)
WBC Count: 4.4 K/uL (ref 4.0–10.5)
nRBC: 0 % (ref 0.0–0.2)

## 2024-06-11 LAB — CMP (CANCER CENTER ONLY)
ALT: 24 U/L (ref 0–44)
AST: 27 U/L (ref 15–41)
Albumin: 4.6 g/dL (ref 3.5–5.0)
Alkaline Phosphatase: 68 U/L (ref 38–126)
Anion gap: 9 (ref 5–15)
BUN: 9 mg/dL (ref 8–23)
CO2: 26 mmol/L (ref 22–32)
Calcium: 10.8 mg/dL — ABNORMAL HIGH (ref 8.9–10.3)
Chloride: 104 mmol/L (ref 98–111)
Creatinine: 0.93 mg/dL (ref 0.61–1.24)
GFR, Estimated: >60 mL/min
Glucose, Bld: 138 mg/dL — ABNORMAL HIGH (ref 70–99)
Potassium: 4.4 mmol/L (ref 3.5–5.1)
Sodium: 138 mmol/L (ref 135–145)
Total Bilirubin: 0.5 mg/dL (ref 0.0–1.2)
Total Protein: 6.6 g/dL (ref 6.5–8.1)

## 2024-06-11 NOTE — Progress Notes (Signed)
 " [facility] [address] [place],  [place]  [address] [phone]  Clinic Day:  06/11/2024   Referring physician: Teressa Harrie HERO, FNP  Patient Care Team: Patient Care Team: Teressa Harrie HERO, FNP as PCP - General (Family Medicine)   REASON FOR CONSULTATION:   Thrombocytopenia HISTORY OF PRESENT ILLNESS:   Johnathan Flynn is a 73 y.o. male with a history of thrombocytopenia who is referred in consultation by [name], FNP for assessment and management. He has a history of hemoptysis with deep cough. He was noted to have a decreasing platelet count from 186 to 118. He denies fever, chills, nausea or vomiting. He denies shortness of breath or chest pain. He denies issue with bowel or bladder. He denies changes in appetite or weight. He currently goes to the gym and is trying to eat healthier. Medical, surgical and family history as below. He is a current smoker and does drink alcohol. He denies use of other drugs.    REVIEW OF SYSTEMS:   Review of Systems  Constitutional: Negative.   HENT:  Negative.    Eyes: Negative.   Respiratory: Negative.    Cardiovascular: Negative.   Gastrointestinal: Negative.   Endocrine: Negative.   Genitourinary: Negative.    Musculoskeletal: Negative.   Skin: Negative.   Neurological: Negative.   Hematological: Negative.   Psychiatric/Behavioral: Negative.       VITALS:   Blood pressure (!) 149/68, pulse 62, temperature 98.2 F (36.8 C), temperature source Oral, resp. rate 20, height 5' 7.5 (1.715 m), weight 241 lb (109.3 kg), SpO2 96%.  Wt Readings from Last 3 Encounters:  06/11/24 241 lb (109.3 kg)  05/07/24 235 lb 3.2 oz (106.7 kg)  04/25/24 233 lb 4.8 oz (105.8 kg)    Body mass index is 37.19 kg/m.  Performance status (ECOG): 1 - Symptomatic but completely ambulatory  PHYSICAL EXAM:   Physical Exam Constitutional:      Appearance: He is obese.  HENT:     Head: Normocephalic and atraumatic.      Mouth/Throat:     Mouth: Mucous membranes are moist.  Cardiovascular:     Rate and Rhythm: Normal rate and regular rhythm.     Pulses: Normal pulses.     Heart sounds: Normal heart sounds.  Pulmonary:     Effort: Pulmonary effort is normal.     Breath sounds: Normal breath sounds.  Abdominal:     General: Bowel sounds are normal.     Palpations: Abdomen is soft.  Musculoskeletal:        General: Normal range of motion.  Skin:    General: Skin is warm and dry.  Neurological:     General: No focal deficit present.     Mental Status: He is alert and oriented to person, place, and time. Mental status is at baseline.  Psychiatric:        Mood and Affect: Mood normal.        Behavior: Behavior normal.        Thought Content: Thought content normal.        Judgment: Judgment normal.      LABS:      Latest Ref Rng & Units 06/11/2024   10:49 AM 05/07/2024   10:26 AM 04/25/2024    1:13 PM  CBC  WBC 4.0 - 10.5 K/uL 4.4  2.7  3.4   Hemoglobin 13.0 - 17.0 g/dL 86.9  87.6  86.7   Hematocrit 39.0 - 52.0 %  38.1  36.6  38.6   Platelets 150 - 400 K/uL 112  117  129       Latest Ref Rng & Units 06/11/2024   10:49 AM 04/25/2024    1:13 PM 04/22/2024    3:30 PM  CMP  Glucose 70 - 99 mg/dL 861  887  90   BUN 8 - 23 mg/dL 9  17  16    Creatinine 0.61 - 1.24 mg/dL 9.06  9.07  8.99   Sodium 135 - 145 mmol/L 138  138  139   Potassium 3.5 - 5.1 mmol/L 4.4  4.4  4.9   Chloride 98 - 111 mmol/L 104  105  101   CO2 22 - 32 mmol/L 26  24  25    Calcium 8.9 - 10.3 mg/dL 89.1  89.9  89.3   Total Protein 6.5 - 8.1 g/dL 6.6  6.8  7.1   Total Bilirubin 0.0 - 1.2 mg/dL 0.5  0.4  0.5   Alkaline Phos 38 - 126 U/L 68  64  68   AST 15 - 41 U/L 27  20  23    ALT 0 - 44 U/L 24  21  25       No results found for: CEA1, CEA / No results found for: CEA1, CEA Lab Results  Component Value Date   PSA1 2.4 01/27/2023   No results found for: CAN199 No results found for: [id number]  Lab Results   Component Value Date   TOTALPROTELP 6.6 04/25/2024   Lab Results  Component Value Date   TIBC 433 04/25/2024   FERRITIN 15 (L) 04/25/2024   IRONPCTSAT 16 (L) 04/25/2024   No results found for: LDH  STUDIES:   No results found.    HISTORY:   Past Medical History:  Diagnosis Date   Hyperlipidemia    Hypertension    Need for hepatitis C screening test 04/02/2022   Prediabetes     Past Surgical History:  Procedure Laterality Date   EYE SURGERY      Family History  Problem Relation Age of Onset   Alcoholism Paternal Grandfather     Social History:  reports that he has been smoking cigars. He does not have any smokeless tobacco history on file. He reports current alcohol use. He reports that he does not use drugs.The patient is alone  today.  Allergies: No Known Allergies  Current Medications: Current Outpatient Medications  Medication Sig Dispense Refill   aspirin EC 81 MG tablet Take 81 mg by mouth daily. Swallow whole.     ferrous sulfate 325 (65 FE) MG EC tablet Take 325 mg by mouth daily at 12 noon.     losartan  (COZAAR ) 50 MG tablet Take 1 tablet (50 mg total) by mouth daily. 90 tablet 3   Multiple Vitamin (MULTIVITAMIN) tablet Take 1 tablet by mouth daily.     omega-3 fish oil (MAXEPA) 1000 MG CAPS capsule Take by mouth.     pantoprazole  (PROTONIX ) 40 MG tablet Take 1 tablet (40 mg total) by mouth daily. 30 tablet 3   Red Yeast Rice 600 MG TABS Take 2 tablets by mouth daily at 12 noon. (Patient taking differently: Take 2 tablets by mouth in the morning and at bedtime.)     tadalafil (CIALIS) 5 MG tablet      tamsulosin (FLOMAX) 0.4 MG CAPS capsule      Vitamin D , Ergocalciferol , (DRISDOL ) 1.25 MG (50000 UNIT) CAPS capsule TAKE 1 CAPSULE (50,000 UNITS TOTAL) BY MOUTH  EVERY 7 (SEVEN) DAYS FOR 12 DOSES. 12 capsule 0   No current facility-administered medications for this visit.     ASSESSMENT & PLAN:   Assessment:  [name] is a 73 y.o. male with  history of hemoptysis and decreasing platelet count. He states that he has coughed up blood in the past and his most recent episode was less than previous episodes. Labs today have improved with normal white count and hemoglobin and platelets decreased at 112. We will plan to continue to monitor and have him return in 3 months. He is also taking OTC iron supplements for anemia.  Plan: 1.  Return to clinic in 3 months to repeat evaluation.   I discussed the assessment and treatment plan with the patient.  The patient was provided an opportunity to ask questions and all were answered.  The patient agreed with the plan and demonstrated an understanding of the instructions.    Thank you for the referral    20 minutes was spent in patient care.  This included time spent preparing to see the patient (e.g., review of tests), obtaining and/or reviewing separately obtained history, counseling and educating the patient/family/caregiver, ordering medications, tests, or procedures; documenting clinical information in the electronic or other health record, independently interpreting results and communicating results to the patient/family/caregiver as well as coordination of care.      Eleanor DELENA Bach, NP   Family Nurse Practitioner - Board Certified [facility] [place] [phone]     "

## 2024-06-11 NOTE — Telephone Encounter (Signed)
 Patient has been scheduled for follow-up visit per 06/11/2024 LOS.  Pt given an appt calendar with date and time.

## 2024-07-18 ENCOUNTER — Other Ambulatory Visit: Payer: Self-pay | Admitting: Family Medicine

## 2024-07-18 DIAGNOSIS — K219 Gastro-esophageal reflux disease without esophagitis: Secondary | ICD-10-CM

## 2024-08-14 ENCOUNTER — Ambulatory Visit: Admitting: Family Medicine

## 2024-09-09 ENCOUNTER — Inpatient Hospital Stay

## 2024-09-09 ENCOUNTER — Inpatient Hospital Stay: Admitting: Hematology and Oncology
